# Patient Record
Sex: Female | Born: 1965
Health system: Southern US, Community
[De-identification: ages and names within clinical notes are randomized; demographics above are authoritative.]

## PROBLEM LIST (undated history)

## (undated) DIAGNOSIS — A630 Anogenital (venereal) warts: Secondary | ICD-10-CM

## (undated) DIAGNOSIS — M069 Rheumatoid arthritis, unspecified: Secondary | ICD-10-CM

## (undated) DIAGNOSIS — K219 Gastro-esophageal reflux disease without esophagitis: Secondary | ICD-10-CM

## (undated) DIAGNOSIS — I10 Essential (primary) hypertension: Secondary | ICD-10-CM

## (undated) DIAGNOSIS — M21619 Bunion of unspecified foot: Secondary | ICD-10-CM

## (undated) DIAGNOSIS — G039 Meningitis, unspecified: Secondary | ICD-10-CM

## (undated) DIAGNOSIS — E669 Obesity, unspecified: Secondary | ICD-10-CM

## (undated) DIAGNOSIS — E7800 Pure hypercholesterolemia, unspecified: Secondary | ICD-10-CM

## (undated) DIAGNOSIS — N751 Abscess of Bartholin's gland: Secondary | ICD-10-CM

## (undated) HISTORY — PX: CATARACT EXTRACTION: SUR2

## (undated) HISTORY — DX: Anogenital (venereal) warts: A63.0

## (undated) HISTORY — DX: Bunion of unspecified foot: M21.619

## (undated) HISTORY — DX: Abscess of Bartholin's gland: N75.1

## (undated) HISTORY — DX: Pure hypercholesterolemia, unspecified: E78.00

## (undated) HISTORY — PX: TOOTH EXTRACTION: SUR596

## (undated) HISTORY — DX: Rheumatoid arthritis, unspecified: M06.9

## (undated) HISTORY — DX: Meningitis, unspecified: G03.9

## (undated) HISTORY — DX: Gastro-esophageal reflux disease without esophagitis: K21.9

## (undated) HISTORY — PX: OTHER SURGICAL HISTORY: SHX169

## (undated) HISTORY — DX: Essential (primary) hypertension: I10

## (undated) HISTORY — DX: Obesity, unspecified: E66.9

---
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
## 2000-06-24 DIAGNOSIS — E7800 Pure hypercholesterolemia, unspecified: Secondary | ICD-10-CM

## 2000-06-24 HISTORY — DX: Pure hypercholesterolemia, unspecified: E78.00

## 2000-12-05 ENCOUNTER — Other Ambulatory Visit: Admission: RE | Admit: 2000-12-05 | Discharge: 2000-12-05 | Payer: Self-pay | Admitting: *Deleted

## 2001-12-28 ENCOUNTER — Other Ambulatory Visit: Admission: RE | Admit: 2001-12-28 | Discharge: 2001-12-28 | Payer: Self-pay | Admitting: Obstetrics and Gynecology

## 2003-01-06 ENCOUNTER — Other Ambulatory Visit: Admission: RE | Admit: 2003-01-06 | Discharge: 2003-01-06 | Payer: Self-pay | Admitting: Obstetrics and Gynecology

## 2004-01-11 ENCOUNTER — Other Ambulatory Visit: Admission: RE | Admit: 2004-01-11 | Discharge: 2004-01-11 | Payer: Self-pay | Admitting: Obstetrics and Gynecology

## 2005-01-11 ENCOUNTER — Other Ambulatory Visit: Admission: RE | Admit: 2005-01-11 | Discharge: 2005-01-11 | Payer: Self-pay | Admitting: Obstetrics and Gynecology

## 2005-02-19 HISTORY — PX: OTHER SURGICAL HISTORY: SHX169

## 2005-04-09 ENCOUNTER — Encounter: Admission: RE | Admit: 2005-04-09 | Discharge: 2005-06-23 | Payer: Self-pay | Admitting: Obstetrics and Gynecology

## 2006-08-04 ENCOUNTER — Other Ambulatory Visit: Admission: RE | Admit: 2006-08-04 | Discharge: 2006-08-04 | Payer: Self-pay | Admitting: Obstetrics and Gynecology

## 2006-09-02 ENCOUNTER — Ambulatory Visit: Payer: Self-pay | Admitting: Infectious Diseases

## 2007-08-14 ENCOUNTER — Other Ambulatory Visit: Admission: RE | Admit: 2007-08-14 | Discharge: 2007-08-14 | Payer: Self-pay | Admitting: Obstetrics and Gynecology

## 2008-08-17 ENCOUNTER — Other Ambulatory Visit: Admission: RE | Admit: 2008-08-17 | Discharge: 2008-08-17 | Payer: Self-pay | Admitting: Obstetrics and Gynecology

## 2010-06-24 DIAGNOSIS — M069 Rheumatoid arthritis, unspecified: Secondary | ICD-10-CM

## 2010-06-24 HISTORY — DX: Rheumatoid arthritis, unspecified: M06.9

## 2010-10-11 ENCOUNTER — Other Ambulatory Visit: Payer: Self-pay | Admitting: Family Medicine

## 2010-10-11 ENCOUNTER — Ambulatory Visit
Admission: RE | Admit: 2010-10-11 | Discharge: 2010-10-11 | Disposition: A | Discharge status: Home / Self Care | Payer: BC Managed Care – PPO | Source: Ambulatory Visit | Attending: Family Medicine | Admitting: Family Medicine

## 2010-10-11 DIAGNOSIS — M79605 Pain in left leg: Secondary | ICD-10-CM

## 2010-10-11 DIAGNOSIS — R609 Edema, unspecified: Secondary | ICD-10-CM

## 2012-08-20 ENCOUNTER — Other Ambulatory Visit: Payer: Self-pay | Admitting: Rheumatology

## 2012-08-20 DIAGNOSIS — M069 Rheumatoid arthritis, unspecified: Secondary | ICD-10-CM

## 2012-08-25 ENCOUNTER — Other Ambulatory Visit: Payer: BC Managed Care – PPO

## 2013-01-19 ENCOUNTER — Encounter: Payer: Self-pay | Admitting: Obstetrics and Gynecology

## 2013-01-20 ENCOUNTER — Encounter: Payer: Self-pay | Admitting: Obstetrics and Gynecology

## 2013-01-20 ENCOUNTER — Ambulatory Visit: Payer: Self-pay | Admitting: Obstetrics and Gynecology

## 2013-01-20 DIAGNOSIS — Z01419 Encounter for gynecological examination (general) (routine) without abnormal findings: Secondary | ICD-10-CM

## 2013-06-24 DIAGNOSIS — N751 Abscess of Bartholin's gland: Secondary | ICD-10-CM

## 2013-06-24 HISTORY — DX: Abscess of Bartholin's gland: N75.1

## 2013-12-20 ENCOUNTER — Encounter: Payer: Self-pay | Admitting: Obstetrics and Gynecology

## 2013-12-20 ENCOUNTER — Ambulatory Visit (INDEPENDENT_AMBULATORY_CARE_PROVIDER_SITE_OTHER): Payer: BC Managed Care – PPO | Admitting: Obstetrics and Gynecology

## 2013-12-20 VITALS — BP 114/80 | HR 76 | Resp 14 | Ht 62.5 in | Wt 194.6 lb

## 2013-12-20 DIAGNOSIS — E785 Hyperlipidemia, unspecified: Secondary | ICD-10-CM

## 2013-12-20 DIAGNOSIS — Z01419 Encounter for gynecological examination (general) (routine) without abnormal findings: Secondary | ICD-10-CM

## 2013-12-20 DIAGNOSIS — N751 Abscess of Bartholin's gland: Secondary | ICD-10-CM | POA: Insufficient documentation

## 2013-12-20 DIAGNOSIS — Z0000 Encounter for general adult medical examination without abnormal findings: Secondary | ICD-10-CM

## 2013-12-20 LAB — CBC
HCT: 40.4 % (ref 36.0–46.0)
HEMOGLOBIN: 13.7 g/dL (ref 12.0–15.0)
MCH: 32 pg (ref 26.0–34.0)
MCHC: 33.9 g/dL (ref 30.0–36.0)
MCV: 94.4 fL (ref 78.0–100.0)
PLATELETS: 262 10*3/uL (ref 150–400)
RBC: 4.28 MIL/uL (ref 3.87–5.11)
RDW: 14 % (ref 11.5–15.5)
WBC: 7 10*3/uL (ref 4.0–10.5)

## 2013-12-20 LAB — LIPID PANEL
CHOL/HDL RATIO: 4.3 ratio
Cholesterol: 217 mg/dL — ABNORMAL HIGH (ref 0–200)
HDL: 51 mg/dL (ref >39)
LDL Cholesterol: 152 mg/dL — ABNORMAL HIGH (ref 0–99)
Triglycerides: 69 mg/dL (ref <150)
VLDL: 14 mg/dL (ref 0–40)

## 2013-12-20 LAB — COMPREHENSIVE METABOLIC PANEL
ALBUMIN: 4.1 g/dL (ref 3.5–5.2)
ALT: 12 U/L (ref 0–35)
AST: 14 U/L (ref 0–37)
Alkaline Phosphatase: 67 U/L (ref 39–117)
BILIRUBIN TOTAL: 0.5 mg/dL (ref 0.2–1.2)
BUN: 16 mg/dL (ref 6–23)
CO2: 27 meq/L (ref 19–32)
Calcium: 9.1 mg/dL (ref 8.4–10.5)
Chloride: 104 mEq/L (ref 96–112)
Creat: 0.82 mg/dL (ref 0.50–1.10)
Glucose, Bld: 101 mg/dL — ABNORMAL HIGH (ref 70–99)
POTASSIUM: 5 meq/L (ref 3.5–5.3)
SODIUM: 138 meq/L (ref 135–145)
TOTAL PROTEIN: 7.5 g/dL (ref 6.0–8.3)

## 2013-12-20 LAB — TSH: TSH: 1.02 u[IU]/mL (ref 0.350–4.500)

## 2013-12-20 MED ORDER — FLUCONAZOLE 150 MG PO TABS: 150.0000 mg | ORAL_TABLET | Freq: Once | ORAL | Status: DC

## 2013-12-20 MED ORDER — CEPHALEXIN 500 MG PO CAPS: ORAL_CAPSULE | ORAL | Status: DC

## 2013-12-20 NOTE — Progress Notes (Signed)
Patient ID: Monique Carney, female   DOB: 1965-09-10, 48 y.o.   MRN: [id number] GYNECOLOGY VISIT  PCP:  None  Referring provider:   HPI: 48 y.o.   Married  Caucasian  female   G1P0010 with Patient's last menstrual period was 04/25/2011.   here for   AEX.  Popped a Bartholin's cyst that came up 5 days ago.  Had "fever" and pain.  Was trying to wait until office visit today so did not go to Urgent Care. Manipulated the gland on her own.  Drained bloody pus.  Had not had this before.  Feeling much better now but it itching.  Not using pads.   Wants labs done.   Hgb:   Urine:  Unable to void  GYNECOLOGIC HISTORY: Patient's last menstrual period was 04/25/2011. Sexually active:   yes Partner preference: female Contraception:  vasectomy  Menopausal hormone therapy: n/a DES exposure: no    Blood transfusions:   no Sexually transmitted diseases:   Condyloma of perirectal area proven on bx 02-19-05 GYN procedures and prior surgeries:  no Last mammogram: 12-17-13 with [facility]:results pending                Last pap and high risk HPV testing:  10-16-09 wnl  History of abnormal pap smear: 10-15 years ago had abnormal pap but no treatment to cervix.  Had repeat paps every six months for several paps and all returned normal    OB History   Grav Para Term Preterm Abortions TAB SAB Ect Mult Living   1    1     0       LIFESTYLE: Exercise:  walking            Tobacco:  no Alcohol:    3-4 glasses of wine per week Drug use:  no  OTHER HEALTH MAINTENANCE: Tetanus/TDap:   ?2004 Gardisil:              n/a Influenza:            03/2013 Zostavax:            n/a  Bone density:     n/a Colonoscopy:    n/a  Cholesterol check:   2010 borderline  Family History  Problem Relation Age of Onset  . CAD Father 61    CABG x 4    There are no active problems to display for this patient.  Past Medical History  Diagnosis Date  . Rheumatoid arthritis 2012    knees and hands  .  Hypercholesteremia 2002  . STD (sexually transmitted disease) 02-19-05    condyloma--periectal w/associated low grade dysplasia--proven on bx    Past Surgical History  Procedure Laterality Date  . Cataract extraction Right   . Perirectal biopsy  02-19-05    --condyloma w/associated low grade dysplasia    ALLERGIES: Review of patient's allergies indicates no known allergies.  Current Outpatient Prescriptions  Medication Sig Dispense Refill  . Aspirin-Acetaminophen-Caffeine (EXCEDRIN PO) Take by mouth.      . Methotrexate, PF, 20 MG/0.4ML SOAJ Inject into the skin.       No current facility-administered medications for this visit.     ROS:  Pertinent items are noted in HPI.  SOCIAL HISTORY:  Married.   PHYSICAL EXAMINATION:    BP 114/80  Pulse 76  Resp 14  Ht 5' 2.5" (1.588 m)  Wt 194 lb 9.6 oz (88.27 kg)  BMI 35.00 kg/m2  LMP 04/25/2011   Wt Readings  from Last 3 Encounters:  12/20/13 194 lb 9.6 oz (88.27 kg)     Ht Readings from Last 3 Encounters:  12/20/13 5' 2.5" (1.588 m)    General appearance: alert, cooperative and appears stated age Head: Normocephalic, without obvious abnormality, atraumatic Neck: no adenopathy, supple, symmetrical, trachea midline and thyroid not enlarged, symmetric, no tenderness/mass/nodules Lungs: clear to auscultation bilaterally Breasts: Inspection negative, No nipple retraction or dimpling, No nipple discharge or bleeding, No axillary or supraclavicular adenopathy, Normal to palpation without dominant masses Heart: regular rate and rhythm Abdomen: soft, non-tender; no masses,  no organomegaly Extremities: extremities normal, atraumatic, no cyanosis or edema Skin: Skin color, texture, turgor normal. No rashes or lesions Lymph nodes: Cervical, supraclavicular, and axillary nodes normal. No abnormal inguinal nodes palpated Neurologic: Grossly normal  Pelvic: External genitalia:  no lesions.              Urethra:  normal appearing  urethra with no masses, tenderness or lesions              Bartholins and Skenes: right with erythema and small amount of pus drainage.  Mildly tender to touch, minimally enlargement.               Vagina: normal appearing vagina with normal color and discharge, no lesions              Cervix: normal appearance              Pap and high risk HPV testing done: Yes.  .            Bimanual Exam:  Uterus:  uterus is normal size, shape, consistency and nontender                                      Adnexa: normal adnexa in size, nontender and no masses                                      Rectovaginal: Confirms                                      Anus:  normal sphincter tone, no lesions  ASSESSMENT  Right Bartholin's gland abscess just drained.  Otherwise normal well woman visit.  History of perianal condyloma and remote history of abnormal pap smear.  History of hyperlipidemia.   PLAN  Mammogram recommended yearly.  Pap smear and high risk HPV testing performed.  Counseled on self breast exam, Calcium and vitamin D intake, exercise. Keflex 500 mg po bid for 7 days.  Rx for Diflucan 150 mg now and at end of course of Keflex.  Return annually or prn   An After Visit Summary was printed and given to the patient.  10 additional minutes face to face time of which over 50% was spent in counseling.

## 2013-12-20 NOTE — Patient Instructions (Signed)
EXERCISE AND DIET:  We recommended that you start or continue a regular exercise program for good health. Regular exercise means any activity that makes your heart beat faster and makes you sweat.  We recommend exercising at least 30 minutes per day at least 3 days a week, preferably 4 or 5.  We also recommend a diet low in fat and sugar.  Inactivity, poor dietary choices and obesity can cause diabetes, heart attack, stroke, and kidney damage, among others.    ALCOHOL AND SMOKING:  Women should limit their alcohol intake to no more than 7 drinks/beers/glasses of wine (combined, not each!) per week. Moderation of alcohol intake to this level decreases your risk of breast cancer and liver damage. And of course, no recreational drugs are part of a healthy lifestyle.  And absolutely no smoking or even second hand smoke. Most people know smoking can cause heart and lung diseases, but did you know it also contributes to weakening of your bones? Aging of your skin?  Yellowing of your teeth and nails?  CALCIUM AND VITAMIN D:  Adequate intake of calcium and Vitamin D are recommended.  The recommendations for exact amounts of these supplements seem to change often, but generally speaking 600 mg of calcium (either carbonate or citrate) and 800 units of Vitamin D per day seems prudent. Certain women may benefit from higher intake of Vitamin D.  If you are among these women, your doctor will have told you during your visit.    PAP SMEARS:  Pap smears, to check for cervical cancer or precancers,  have traditionally been done yearly, although recent scientific advances have shown that most women can have pap smears less often.  However, every woman still should have a physical exam from her gynecologist every year. It will include a breast check, inspection of the vulva and vagina to check for abnormal growths or skin changes, a visual exam of the cervix, and then an exam to evaluate the size and shape of the uterus and  ovaries.  And after 49 years of age, a rectal exam is indicated to check for rectal cancers. We will also provide age appropriate advice regarding health maintenance, like when you should have certain vaccines, screening for sexually transmitted diseases, bone density testing, colonoscopy, mammograms, etc.   MAMMOGRAMS:  All women over 40 years old should have a yearly mammogram. Many facilities now offer a "3D" mammogram, which may cost around $50 extra out of pocket. If possible,  we recommend you accept the option to have the 3D mammogram performed.  It both reduces the number of women who will be called back for extra views which then turn out to be normal, and it is better than the routine mammogram at detecting truly abnormal areas.    COLONOSCOPY:  Colonoscopy to screen for colon cancer is recommended for all women at age 50.  We know, you hate the idea of the prep.  We agree, BUT, having colon cancer and not knowing it is worse!!  Colon cancer so often starts as a polyp that can be seen and removed at colonscopy, which can quite literally save your life!  And if your first colonoscopy is normal and you have no family history of colon cancer, most women don't have to have it again for 10 years.  Once every ten years, you can do something that may end up saving your life, right?  We will be happy to help you get it scheduled when you are ready.    Be sure to check your insurance coverage so you understand how much it will cost.  It may be covered as a preventative service at no cost, but you should check your particular policy.     Tetanus, Diphtheria, Pertussis (Tdap) Vaccine What You Need to Know WHY GET VACCINATED? Tetanus, diphtheria and pertussis can be very serious diseases, even for adolescents and adults. Tdap vaccine can protect [place] from these diseases. TETANUS (Lockjaw) causes painful muscle tightening and stiffness, usually all over the body.  It can lead to tightening of muscles in the  head and neck so you can't open your mouth, swallow, or sometimes even breathe. Tetanus kills about 1 out of 5 people who are infected. DIPHTHERIA can cause a thick coating to form in the back of the throat.  It can lead to breathing problems, paralysis, heart failure, and death. PERTUSSIS (Whooping Cough) causes severe coughing spells, which can cause difficulty breathing, vomiting and disturbed sleep.  It can also lead to weight loss, incontinence, and rib fractures. Up to 2 in 100 adolescents and 5 in 100 adults with pertussis are hospitalized or have complications, which could include pneumonia and death. These diseases are caused by bacteria. Diphtheria and pertussis are spread from person to person through coughing or sneezing. Tetanus enters the body through cuts, scratches, or wounds. Before vaccines, the [place] saw as many as 200,000 cases a year of diphtheria and pertussis, and hundreds of cases of tetanus. Since vaccination began, tetanus and diphtheria have dropped by about 99% and pertussis by about 80%. TDAP VACCINE Tdap vaccine can protect adolescents and adults from tetanus, diphtheria, and pertussis. One dose of Tdap is routinely given at age 46 or 70. People who did not get Tdap at that age should get it as soon as possible. Tdap is especially important for health care professionals and anyone having close contact with a baby younger than 12 months. Pregnant women should get a dose of Tdap during every pregnancy, to protect the newborn from pertussis. Infants are most at risk for severe, life-threatening complications from pertussis. A similar vaccine, called Td, protects from tetanus and diphtheria, but not pertussis. A Td booster should be given every 10 years. Tdap may be given as one of these boosters if you have not already gotten a dose. Tdap may also be given after a severe cut or burn to prevent tetanus infection. Your doctor can give you more information. Tdap may  safely be given at the same time as other vaccines. SOME PEOPLE SHOULD NOT GET THIS VACCINE  If you ever had a life-threatening allergic reaction after a dose of any tetanus, diphtheria, or pertussis containing vaccine, OR if you have a severe allergy to any part of this vaccine, you should not get Tdap. Tell your doctor if you have any severe allergies.  If you had a coma, or long or multiple seizures within 7 days after a childhood dose of DTP or DTaP, you should not get Tdap, unless a cause other than the vaccine was found. You can still get Td.  Talk to your doctor if you:  have epilepsy or another nervous system problem,  had severe pain or swelling after any vaccine containing diphtheria, tetanus or pertussis,  ever had Guillain-Barr Syndrome (GBS),  aren't feeling well on the day the shot is scheduled. RISKS OF A VACCINE REACTION With any medicine, including vaccines, there is a chance of side effects. These are usually mild and go away on their own, but  serious reactions are also possible. Brief fainting spells can follow a vaccination, leading to injuries from falling. Sitting or lying down for about 15 minutes can help prevent these. Tell your doctor if you feel dizzy or light-headed, or have vision changes or ringing in the ears. Mild problems following Tdap (Did not interfere with activities)  Pain where the shot was given (about 3 in 4 adolescents or 2 in 3 adults)  Redness or swelling where the shot was given (about 1 person in 5)  Mild fever of at least 100.93F (up to about 1 in 25 adolescents or 1 in 100 adults)  Headache (about 3 or 4 people in 10)  Tiredness (about 1 person in 3 or 4)  Nausea, vomiting, diarrhea, stomach ache (up to 1 in 4 adolescents or 1 in 10 adults)  Chills, body aches, sore joints, rash, swollen glands (uncommon) Moderate problems following Tdap (Interfered with activities, but did not require medical attention)  Pain where the shot was  given (about 1 in 5 adolescents or 1 in 100 adults)  Redness or swelling where the shot was given (up to about 1 in 16 adolescents or 1 in 25 adults)  Fever over 102F (about 1 in 100 adolescents or 1 in 250 adults)  Headache (about 3 in 20 adolescents or 1 in 10 adults)  Nausea, vomiting, diarrhea, stomach ache (up to 1 or 3 people in 100)  Swelling of the entire arm where the shot was given (up to about 3 in 100). Severe problems following Tdap (Unable to perform usual activities, required medical attention)  Swelling, severe pain, bleeding and redness in the arm where the shot was given (rare). A severe allergic reaction could occur after any vaccine (estimated less than 1 in a million doses). WHAT IF THERE IS A SERIOUS REACTION? What should I look for?  Look for anything that concerns you, such as signs of a severe allergic reaction, very high fever, or behavior changes. Signs of a severe allergic reaction can include hives, swelling of the face and throat, difficulty breathing, a fast heartbeat, dizziness, and weakness. These would start a few minutes to a few hours after the vaccination. What should I do?  If you think it is a severe allergic reaction or other emergency that can't wait, call 9-1-1 or get the person to the nearest hospital. Otherwise, call your doctor.  Afterward, the reaction should be reported to the "Vaccine Adverse Event Reporting System" (VAERS). Your doctor might file this report, or you can do it yourself through the VAERS web site at www.vaers.SamedayNews.es, or by calling 904-202-5293. VAERS is only for reporting reactions. They do not give medical advice.  THE NATIONAL VACCINE INJURY COMPENSATION PROGRAM The National Vaccine Injury Compensation Program (VICP) is a federal program that was created to compensate people who may have been injured by certain vaccines. Persons who believe they may have been injured by a vaccine can learn about the program and about  filing a claim by calling (617)637-8535 or visiting the [place] website at GoldCloset.com.ee. HOW CAN I LEARN MORE?  Ask your doctor.  Call your local or state health department.  Contact the Centers for Disease Control and Prevention (CDC):  Call 954-770-4564 or visit CDC's website at http://hunter.com/. CDC Tdap Vaccine VIS (10/31/11) Document Released: 12/10/2011 Document Revised: 10/05/2012 Document Reviewed: 09/30/2012 ExitCare Patient Information 2015 [place], [place]. This information is not intended to replace advice given to you by your health care provider. Make sure you discuss any questions  you have with your health care provider.

## 2013-12-22 LAB — IPS PAP TEST WITH HPV

## 2014-03-07 ENCOUNTER — Encounter: Payer: Self-pay | Admitting: Obstetrics and Gynecology

## 2014-03-30 ENCOUNTER — Telehealth: Payer: Self-pay | Admitting: Obstetrics and Gynecology

## 2014-03-30 NOTE — Telephone Encounter (Signed)
Spoke with patient. Patient states that her home voicemails roll over to her husbands phone and he told her that she received a call last week from our office. Advised I will speak with Dr.Silva and Estill Bamberg, CMA to see if they needed to discuss anything with patient and return call. Patient is agreeable.

## 2014-03-30 NOTE — Telephone Encounter (Signed)
Pt said that her husband relayed a message that she needed to call dr Quincy Simmonds back? I dont see any message at all.

## 2014-03-30 NOTE — Telephone Encounter (Signed)
It looks like the patient's appointment was changed for her upcoming annual in July.  Please confirm that she is aware of the current date.  I did not call the patient and I see no phone note from any clinical staff.  Thanks.

## 2014-03-31 NOTE — Telephone Encounter (Signed)
Left message to call Norlene Lanes at [phone]. 

## 2014-04-05 NOTE — Telephone Encounter (Signed)
Spoke with patient. Advised aex was moved from 01/05/2015 to 01/11/2015 at 8am with Dr.Silva. Patient is agreeable and verbalizes understanding.  Routing to provider for final review. Patient agreeable to disposition. Will close encounter

## 2014-04-25 ENCOUNTER — Encounter: Payer: Self-pay | Admitting: Obstetrics and Gynecology

## 2014-09-27 ENCOUNTER — Ambulatory Visit
Admission: RE | Admit: 2014-09-27 | Discharge: 2014-09-27 | Disposition: A | Discharge status: Home / Self Care | Payer: BLUE CROSS/BLUE SHIELD | Source: Ambulatory Visit | Attending: Family Medicine | Admitting: Family Medicine

## 2014-09-27 ENCOUNTER — Other Ambulatory Visit: Payer: Self-pay | Admitting: Family Medicine

## 2014-09-27 DIAGNOSIS — R05 Cough: Secondary | ICD-10-CM

## 2014-09-27 DIAGNOSIS — R053 Chronic cough: Secondary | ICD-10-CM

## 2014-11-24 ENCOUNTER — Ambulatory Visit: Payer: Self-pay | Admitting: Family Medicine

## 2015-01-05 ENCOUNTER — Ambulatory Visit: Payer: BC Managed Care – PPO | Admitting: Obstetrics and Gynecology

## 2015-01-11 ENCOUNTER — Telehealth: Payer: Self-pay | Admitting: Obstetrics and Gynecology

## 2015-01-11 ENCOUNTER — Ambulatory Visit (INDEPENDENT_AMBULATORY_CARE_PROVIDER_SITE_OTHER): Payer: BLUE CROSS/BLUE SHIELD | Admitting: Obstetrics and Gynecology

## 2015-01-11 ENCOUNTER — Encounter: Payer: Self-pay | Admitting: Obstetrics and Gynecology

## 2015-01-11 VITALS — BP 118/82 | HR 80 | Resp 18 | Ht 62.5 in | Wt 215.6 lb

## 2015-01-11 DIAGNOSIS — Z01419 Encounter for gynecological examination (general) (routine) without abnormal findings: Secondary | ICD-10-CM

## 2015-01-11 LAB — POCT URINALYSIS DIPSTICK
Bilirubin, UA: NEGATIVE
Glucose, UA: NEGATIVE
Ketones, UA: NEGATIVE
Leukocytes, UA: NEGATIVE
NITRITE UA: NEGATIVE
PH UA: 5
Protein, UA: NEGATIVE
RBC UA: NEGATIVE
Urobilinogen, UA: NEGATIVE

## 2015-01-11 NOTE — Progress Notes (Signed)
Patient ID: Monique Carney, female   DOB: February 25, 1966, 49 y.o.   MRN: [id number] 49 y.o. G19P0010 Married Caucasian female here for annual exam.    Now high blood pressure and elevated cholesterol.  States she needs to exercise more. Struggling with rheumatoid arthritis.   PCP:   Antony Blackbird, MD, Has Cardiologist also  Patient's last menstrual period was 04/25/2011.          Sexually active: Yes.   husband The current method of family planning is vasectomy/postmenopausal.    Exercising: No.  none. Smoker:  no  Health Maintenance: Pap:  12-20-13 wnl:neg HR HPV History of abnormal Pap:  Yes, 2000 had abnormal pap but not treatment to cervix. MMG:  6-25-15Density Cat:A Fatty tissue/neg:[facility] Colonoscopy:  01-06-15 polyps with Dr. Collene Mares.  Next due in 4-5 years per pt. BMD:   n/a  Result  n/a TDaP:  ?2004.  Declines vaccine today.  Screening Labs:  Hb today: PCP, Urine today: Neg   reports that she has never smoked. She does not have any smokeless tobacco history on file. She reports that she drinks about 3.0 oz of alcohol per week. She reports that she does not use illicit drugs.  Past Medical History  Diagnosis Date  . Rheumatoid arthritis 2012    knees and hands  . Hypercholesteremia 2002  . STD (sexually transmitted disease) 02-19-05    condyloma--periectal w/associated low grade dysplasia--proven on bx  . Hypertension   . Bartholin's gland abscess 2015    right    Past Surgical History  Procedure Laterality Date  . Cataract extraction Right   . Perirectal biopsy  02-19-05    --condyloma w/associated low grade dysplasia    Current Outpatient Prescriptions  Medication Sig Dispense Refill  . Aspirin-Acetaminophen-Caffeine (EXCEDRIN PO) Take by mouth.    . CRESTOR 20 MG tablet Take 1 tablet by mouth daily.  0  . lisinopril (PRINIVIL,ZESTRIL) 10 MG tablet Take 1 tablet by mouth daily.  1  . methotrexate (RHEUMATREX) 2.5 MG tablet TK 8 TS PO 1 TIME A WEEK  0  . naproxen (NAPROSYN)  500 MG tablet Take 1 tablet by mouth daily.  1  . traMADol (ULTRAM) 50 MG tablet Take 1 tablet by mouth as needed.  0   No current facility-administered medications for this visit.    Family History  Problem Relation Age of Onset  . CAD Father 26    CABG x 4    ROS:  Pertinent items are noted in HPI.  Otherwise, a comprehensive ROS was negative.  Exam:   BP 118/82 mmHg  Pulse 80  Resp 18  Ht 5' 2.5" (1.588 m)  Wt 215 lb 9.6 oz (97.796 kg)  BMI 38.78 kg/m2  LMP 04/25/2011    General appearance: alert, cooperative and appears stated age Head: Normocephalic, without obvious abnormality, atraumatic Neck: no adenopathy, supple, symmetrical, trachea midline and thyroid normal to inspection and palpation Lungs: clear to auscultation bilaterally Breasts: normal appearance, no masses or tenderness, Inspection negative, No nipple retraction or dimpling, No nipple discharge or bleeding, No axillary or supraclavicular adenopathy Heart: regular rate and rhythm Abdomen: soft, non-tender; bowel sounds normal; no masses,  no organomegaly Extremities: extremities normal, atraumatic, no cyanosis or edema Skin: Skin color, texture, turgor normal. No rashes or lesions Lymph nodes: Cervical, supraclavicular, and axillary nodes normal. No abnormal inguinal nodes palpated Neurologic: Grossly normal  Pelvic: External genitalia:  no lesions  Urethra:  normal appearing urethra with no masses, tenderness or lesions              Bartholins and Skenes: normal                 Vagina: normal appearing vagina with normal color and discharge, no lesions              Cervix: no lesions              Pap taken: No. Bimanual Exam:  Uterus:  normal size, contour, position, consistency, mobility, non-tender              Adnexa: normal adnexa and no mass, fullness, tenderness              Rectovaginal: Yes.  .  Confirms.              Anus:  normal sphincter tone, no lesions  Chaperone was present  for exam.  Assessment:   Well woman visit with normal exam. History of perianal condyloma and remote history of abnormal pap smear.   Plan: Yearly mammogram recommended after age 59.  Recommended self breast exam.  Pap and HR HPV as above. Pap next year with HR HPV ttesting. Discussed Calcium, Vitamin D, regular exercise program including cardiovascular and weight bearing exercise. Labs performed.  No..     Refills given on medications.  No..    Follow up annually and prn.    After visit summary provided.

## 2015-01-11 NOTE — Patient Instructions (Signed)

## 2015-01-11 NOTE — Telephone Encounter (Signed)
Patient gave the name of her cardiologist at check out, Neldon Labella, NP-C @ [facility]

## 2015-01-11 NOTE — Telephone Encounter (Signed)
Thank you. Encounter closed. 

## 2015-08-04 ENCOUNTER — Other Ambulatory Visit: Payer: Self-pay | Admitting: Family

## 2015-08-04 ENCOUNTER — Ambulatory Visit
Admission: RE | Admit: 2015-08-04 | Discharge: 2015-08-04 | Disposition: A | Discharge status: Home / Self Care | Payer: BLUE CROSS/BLUE SHIELD | Source: Ambulatory Visit | Attending: Family | Admitting: Family

## 2015-08-04 DIAGNOSIS — M25551 Pain in right hip: Secondary | ICD-10-CM

## 2015-09-26 DIAGNOSIS — H5213 Myopia, bilateral: Secondary | ICD-10-CM | POA: Diagnosis not present

## 2015-09-29 ENCOUNTER — Other Ambulatory Visit: Payer: Self-pay | Admitting: Family Medicine

## 2015-09-29 ENCOUNTER — Other Ambulatory Visit (HOSPITAL_COMMUNITY)
Admission: RE | Admit: 2015-09-29 | Discharge: 2015-09-29 | Disposition: A | Discharge status: Home / Self Care | Payer: BLUE CROSS/BLUE SHIELD | Source: Ambulatory Visit | Attending: Family Medicine | Admitting: Family Medicine

## 2015-09-29 DIAGNOSIS — Z79899 Other long term (current) drug therapy: Secondary | ICD-10-CM | POA: Diagnosis not present

## 2015-09-29 DIAGNOSIS — Z01419 Encounter for gynecological examination (general) (routine) without abnormal findings: Secondary | ICD-10-CM | POA: Diagnosis not present

## 2015-09-29 DIAGNOSIS — E785 Hyperlipidemia, unspecified: Secondary | ICD-10-CM | POA: Diagnosis not present

## 2015-10-02 LAB — CYTOLOGY - PAP

## 2015-10-04 DIAGNOSIS — M2550 Pain in unspecified joint: Secondary | ICD-10-CM | POA: Diagnosis not present

## 2015-10-04 DIAGNOSIS — Z79899 Other long term (current) drug therapy: Secondary | ICD-10-CM | POA: Diagnosis not present

## 2015-10-04 DIAGNOSIS — M0589 Other rheumatoid arthritis with rheumatoid factor of multiple sites: Secondary | ICD-10-CM | POA: Diagnosis not present

## 2016-01-03 DIAGNOSIS — M0589 Other rheumatoid arthritis with rheumatoid factor of multiple sites: Secondary | ICD-10-CM | POA: Diagnosis not present

## 2016-01-03 DIAGNOSIS — Z79899 Other long term (current) drug therapy: Secondary | ICD-10-CM | POA: Diagnosis not present

## 2016-01-03 DIAGNOSIS — M2550 Pain in unspecified joint: Secondary | ICD-10-CM | POA: Diagnosis not present

## 2016-01-31 ENCOUNTER — Ambulatory Visit: Payer: BLUE CROSS/BLUE SHIELD | Admitting: Obstetrics and Gynecology

## 2016-03-20 DIAGNOSIS — Z23 Encounter for immunization: Secondary | ICD-10-CM | POA: Diagnosis not present

## 2016-04-09 ENCOUNTER — Telehealth: Payer: Self-pay

## 2016-04-09 NOTE — Telephone Encounter (Signed)
Left message to call [address] at [phone].   Received notification from [facility] that the patient has not returned for recommended 6 month follow up left breast ultrasound for left breast mass. Need to help facilitate scheduling. Patient is also overdue for her annual exam with Dr.Silva.

## 2016-04-15 NOTE — Telephone Encounter (Signed)
Will forward to clinical supervisor regarding protocol for mammogram recall for this patient.   Cc- Lamont Snowball

## 2016-04-15 NOTE — Telephone Encounter (Signed)
Spoke with patient. Patient states that she had her aex in February this year with her PCP. States she will contact [facility] at this time to schedule her 6 month follow up ultrasound appointment. Offered to assist patient with scheduling, but patient declines.  Routing to provider for final review.

## 2016-04-19 DIAGNOSIS — M0589 Other rheumatoid arthritis with rheumatoid factor of multiple sites: Secondary | ICD-10-CM | POA: Diagnosis not present

## 2016-04-19 DIAGNOSIS — Z79899 Other long term (current) drug therapy: Secondary | ICD-10-CM | POA: Diagnosis not present

## 2016-04-23 ENCOUNTER — Encounter: Payer: Self-pay | Admitting: *Deleted

## 2016-04-23 NOTE — Telephone Encounter (Signed)
To your desk for review and signature.

## 2016-04-29 ENCOUNTER — Encounter: Payer: Self-pay | Admitting: *Deleted

## 2016-07-03 DIAGNOSIS — M2550 Pain in unspecified joint: Secondary | ICD-10-CM | POA: Diagnosis not present

## 2016-07-03 DIAGNOSIS — M0589 Other rheumatoid arthritis with rheumatoid factor of multiple sites: Secondary | ICD-10-CM | POA: Diagnosis not present

## 2016-07-03 DIAGNOSIS — Z1321 Encounter for screening for nutritional disorder: Secondary | ICD-10-CM | POA: Diagnosis not present

## 2016-07-03 DIAGNOSIS — Z79899 Other long term (current) drug therapy: Secondary | ICD-10-CM | POA: Diagnosis not present

## 2016-07-13 DIAGNOSIS — Z1231 Encounter for screening mammogram for malignant neoplasm of breast: Secondary | ICD-10-CM | POA: Diagnosis not present

## 2016-07-13 LAB — HM MAMMOGRAPHY

## 2016-07-31 ENCOUNTER — Encounter: Payer: Self-pay | Admitting: Obstetrics and Gynecology

## 2016-09-08 DIAGNOSIS — M25532 Pain in left wrist: Secondary | ICD-10-CM | POA: Diagnosis not present

## 2016-10-04 DIAGNOSIS — M0589 Other rheumatoid arthritis with rheumatoid factor of multiple sites: Secondary | ICD-10-CM | POA: Diagnosis not present

## 2016-10-04 DIAGNOSIS — Z79899 Other long term (current) drug therapy: Secondary | ICD-10-CM | POA: Diagnosis not present

## 2017-01-02 DIAGNOSIS — M7060 Trochanteric bursitis, unspecified hip: Secondary | ICD-10-CM | POA: Diagnosis not present

## 2017-01-02 DIAGNOSIS — Z79899 Other long term (current) drug therapy: Secondary | ICD-10-CM | POA: Diagnosis not present

## 2017-01-02 DIAGNOSIS — E669 Obesity, unspecified: Secondary | ICD-10-CM | POA: Diagnosis not present

## 2017-01-02 DIAGNOSIS — M0589 Other rheumatoid arthritis with rheumatoid factor of multiple sites: Secondary | ICD-10-CM | POA: Diagnosis not present

## 2017-01-16 ENCOUNTER — Encounter: Payer: Self-pay | Admitting: Family Medicine

## 2017-01-16 ENCOUNTER — Ambulatory Visit (INDEPENDENT_AMBULATORY_CARE_PROVIDER_SITE_OTHER): Payer: BLUE CROSS/BLUE SHIELD | Admitting: Family Medicine

## 2017-01-16 VITALS — BP 132/94 | HR 87 | Temp 97.8°F | Wt 221.2 lb

## 2017-01-16 DIAGNOSIS — K1120 Sialoadenitis, unspecified: Secondary | ICD-10-CM

## 2017-01-16 DIAGNOSIS — R7989 Other specified abnormal findings of blood chemistry: Secondary | ICD-10-CM | POA: Diagnosis not present

## 2017-01-16 DIAGNOSIS — I10 Essential (primary) hypertension: Secondary | ICD-10-CM

## 2017-01-16 DIAGNOSIS — Z13220 Encounter for screening for lipoid disorders: Secondary | ICD-10-CM | POA: Diagnosis not present

## 2017-01-16 DIAGNOSIS — R635 Abnormal weight gain: Secondary | ICD-10-CM | POA: Diagnosis not present

## 2017-01-16 LAB — COMPREHENSIVE METABOLIC PANEL
ALT: 35 U/L (ref 0–35)
AST: 17 U/L (ref 0–37)
Albumin: 4.2 g/dL (ref 3.5–5.2)
Alkaline Phosphatase: 73 U/L (ref 39–117)
BUN: 13 mg/dL (ref 6–23)
CO2: 28 mEq/L (ref 19–32)
Calcium: 9.6 mg/dL (ref 8.4–10.5)
Chloride: 107 mEq/L (ref 96–112)
Creatinine, Ser: 0.81 mg/dL (ref 0.40–1.20)
GFR: 78.86 mL/min (ref >60.00)
Glucose, Bld: 102 mg/dL — ABNORMAL HIGH (ref 70–99)
Potassium: 4.9 mEq/L (ref 3.5–5.1)
Sodium: 142 mEq/L (ref 135–145)
Total Bilirubin: 0.6 mg/dL (ref 0.2–1.2)
Total Protein: 7.3 g/dL (ref 6.0–8.3)

## 2017-01-16 LAB — CBC WITH DIFFERENTIAL/PLATELET
Basophils Absolute: 0 10*3/uL (ref 0.0–0.1)
Basophils Relative: 0.5 % (ref 0.0–3.0)
Eosinophils Absolute: 0.2 10*3/uL (ref 0.0–0.7)
Eosinophils Relative: 3 % (ref 0.0–5.0)
HCT: 42 % (ref 36.0–46.0)
Hemoglobin: 14.1 g/dL (ref 12.0–15.0)
Lymphocytes Relative: 21.3 % (ref 12.0–46.0)
Lymphs Abs: 1.5 10*3/uL (ref 0.7–4.0)
MCHC: 33.6 g/dL (ref 30.0–36.0)
MCV: 97.2 fl (ref 78.0–100.0)
Monocytes Absolute: 0.6 10*3/uL (ref 0.1–1.0)
Monocytes Relative: 8.4 % (ref 3.0–12.0)
Neutro Abs: 4.6 10*3/uL (ref 1.4–7.7)
Neutrophils Relative %: 66.8 % (ref 43.0–77.0)
Platelets: 220 10*3/uL (ref 150.0–400.0)
RBC: 4.32 Mil/uL (ref 3.87–5.11)
RDW: 14.4 % (ref 11.5–15.5)
WBC: 6.8 10*3/uL (ref 4.0–10.5)

## 2017-01-16 LAB — LIPID PANEL
Cholesterol: 219 mg/dL — ABNORMAL HIGH (ref 0–200)
HDL: 44.4 mg/dL (ref >39.00)
LDL Cholesterol: 145 mg/dL — ABNORMAL HIGH (ref 0–99)
NonHDL: 174.15
Total CHOL/HDL Ratio: 5
Triglycerides: 144 mg/dL (ref 0.0–149.0)
VLDL: 28.8 mg/dL (ref 0.0–40.0)

## 2017-01-16 LAB — HEMOGLOBIN A1C: Hgb A1c MFr Bld: 5.6 % (ref 4.6–6.5)

## 2017-01-16 LAB — VITAMIN D 25 HYDROXY (VIT D DEFICIENCY, FRACTURES): VITD: 34.62 ng/mL (ref 30.00–100.00)

## 2017-01-16 MED ORDER — AMOXICILLIN-POT CLAVULANATE 875-125 MG PO TABS: 1.0000 | ORAL_TABLET | Freq: Two times a day (BID) | ORAL | 0 refills | Status: DC

## 2017-01-16 NOTE — Progress Notes (Signed)
Monique Carney is a 51 y.o. female is here to Monique Carney.   Patient Care Team: Monique Deutscher, DO as PCP - General (Family Medicine)   History of Present Illness:   Monique Carney CMA acting as scribe for Dr. Juleen Carney.  HPI: Patient comes in today to establish care. She is coming from [place]. She has multiple concerns.   Lump: Patient comes in today with a lump in her left jaw line that started Sunday night. She does have some pain in that area. Has a sore inside the mouth on the cheek area. Left paridrotic gland swollen upon exam. Will prescribe antibiotic today.   Hypertension: Patients blood pressure stable right now. When she checks at the stores diastolic number has been in the 100's. Patient does snore at night. Will get labs today and possibly order a home sleep study. Get blood pressure machine to check at home. Will give log to return to [place]. Will work on weight loss to help with blood pressure.   RA: Patient sees Monique Carney rheumatology. She was diagnosed about 7 years ago. She does not get injections in the hip for her hip pain. She does not exercise any. She does not know what to do about the exercise. Not a gym person. She has gained about 60 pounds since being being diagnosed. Will place referral to [facility] to help with the pain.   Weight gain: She is concerned about weight gain. Discussed possibly going on weight loss medication. Will get labs then discuss weight loss. Will place referral to [facility] to help with weight loss.  The 10-year ASCVD risk score Monique Carney., et al., 2013) is: 2.2%   Values used to calculate the score:     Age: 42 years     Sex: Female     Is Non-Hispanic African American: No     Diabetic: No     Tobacco smoker: No     Systolic Blood Pressure: 676 mmHg     Is BP treated: No     HDL Cholesterol: 44.4 mg/dL     Total Cholesterol: 219 mg/dL  Health Maintenance Due  Topic Date Due  . HIV Screening  11/15/1979  . TETANUS/TDAP   06/24/2012  . COLONOSCOPY  11/15/2014   PMHx, SurgHx, SocialHx, Medications, and Allergies were reviewed in the Visit Navigator and updated as appropriate.   Past Medical History:  Diagnosis Date  . Bartholin's gland abscess, right   . Condyloma acuminatum of perianal region   . Hypercholesteremia   . Hypertension   . Rheumatoid arthritis Monique Carney)    Past Surgical History:  Procedure Laterality Date  . CATARACT EXTRACTION Right   . perirectal biopsy  02-19-05   --condyloma w/associated low grade dysplasia   Family History  Problem Relation Age of Onset  . CAD Monique Carney 68       CABG x 4   Social History  Substance Use Topics  . Smoking status: Former Smoker    Types: Cigarettes  . Smokeless tobacco: Never Used     Comment: Smoked from ages [age] years old.   . Alcohol use 3.0 oz/week    5 Standard drinks or equivalent per week     Comment: 3-5 glasses of wine per week   Current Medications and Allergies:   .  Aspirin-Acetaminophen-Caffeine (EXCEDRIN PO), Take by mouth., Disp: , Rfl:  .  CRESTOR 20 MG tablet, Take 1 tablet by mouth daily., Disp: , Rfl: 0 .  methotrexate ([place])  2.5 MG tablet, TK 8 TS PO 1 TIME A WEEK, Disp: , Rfl: 0 .  naproxen (NAPROSYN) 500 MG tablet, Take 1 tablet by mouth daily., Disp: , Rfl: 1 .  traMADol (ULTRAM) 50 MG tablet, Take 1 tablet by mouth as needed., Disp: , Rfl: 0  No Known Allergies   Review of Systems:   Pertinent items are noted in the HPI. Otherwise, ROS is negative.  Vitals:   Vitals:   01/16/17 0742  BP: (!) 132/94  Pulse: 87  Temp: 97.8 F (36.6 C)  TempSrc: Oral  SpO2: 96%  Weight: 221 lb 3.2 oz (100.3 kg)     Body mass index is 39.81 kg/m.  Physical Exam:   Physical Exam  Constitutional: She appears well-developed and well-nourished. No distress.  HENT:  Head: Normocephalic and atraumatic.  Left parotid swelling and ttp.  Eyes: Pupils are equal, round, and reactive to light. EOM are normal.  Neck: Normal  range of motion. Neck supple.  Cardiovascular: Normal rate, regular rhythm, normal heart sounds and intact distal pulses.   Pulmonary/Chest: Effort normal.  Abdominal: Soft.  Skin: Skin is warm.  Psychiatric: She has a normal mood and affect. Her behavior is normal.  Nursing note and vitals reviewed.  Results for orders placed or performed in visit on 01/16/17  CBC with Differential/Platelet  Result Value Ref Range   WBC 6.8 4.0 - 10.5 K/uL   RBC 4.32 3.87 - 5.11 Mil/uL   Hemoglobin 14.1 12.0 - 15.0 g/dL   HCT 42.0 36.0 - 46.0 %   MCV 97.2 78.0 - 100.0 fl   MCHC 33.6 30.0 - 36.0 g/dL   RDW 14.4 11.5 - 15.5 %   Platelets 220.0 150.0 - 400.0 K/uL   Neutrophils Relative % 66.8 43.0 - 77.0 %   Lymphocytes Relative 21.3 12.0 - 46.0 %   Monocytes Relative 8.4 3.0 - 12.0 %   Eosinophils Relative 3.0 0.0 - 5.0 %   Basophils Relative 0.5 0.0 - 3.0 %   Neutro Abs 4.6 1.4 - 7.7 K/uL   Lymphs Abs 1.5 0.7 - 4.0 K/uL   Monocytes Absolute 0.6 0.1 - 1.0 K/uL   Eosinophils Absolute 0.2 0.0 - 0.7 K/uL   Basophils Absolute 0.0 0.0 - 0.1 K/uL  Comprehensive metabolic panel  Result Value Ref Range   Sodium 142 135 - 145 mEq/L   Potassium 4.9 3.5 - 5.1 mEq/L   Chloride 107 96 - 112 mEq/L   CO2 28 19 - 32 mEq/L   Glucose, Bld 102 (H) 70 - 99 mg/dL   BUN 13 6 - 23 mg/dL   Creatinine, Ser 0.81 0.40 - 1.20 mg/dL   Total Bilirubin 0.6 0.2 - 1.2 mg/dL   Alkaline Phosphatase 73 39 - 117 U/L   AST 17 0 - 37 U/L   ALT 35 0 - 35 U/L   Total Protein 7.3 6.0 - 8.3 g/dL   Albumin 4.2 3.5 - 5.2 g/dL   Calcium 9.6 8.4 - 10.5 mg/dL   GFR 78.86 >60.00 mL/min  Lipid panel  Result Value Ref Range   Cholesterol 219 (H) 0 - 200 mg/dL   Triglycerides 144.0 0.0 - 149.0 mg/dL   HDL 44.40 >39.00 mg/dL   VLDL 28.8 0.0 - 40.0 mg/dL   LDL Cholesterol 145 (H) 0 - 99 mg/dL   Total CHOL/HDL Ratio 5    NonHDL 174.15   VITAMIN D 25 Hydroxy (Vit-D Deficiency, Fractures)  Result Value Ref Range   VITD 34.62  30.00 -  100.00 ng/mL  Hemoglobin A1c  Result Value Ref Range   Hgb A1c MFr Bld 5.6 4.6 - 6.5 %   Assessment and Plan:   Monique Carney was seen today for establish care.  Diagnoses and all orders for this visit:  Parotitis Comments: Mild. See AVS. Orders: -     CBC with Differential/Platelet -     amoxicillin-clavulanate (AUGMENTIN) 875-125 MG tablet; Take 1 tablet by mouth 2 (two) times daily.  Essential hypertension Comments: Will work on weight loss to bring down BP. I asked her to monitor.  Orders: -     CBC with Differential/Platelet -     Comprehensive metabolic panel -     Lipid panel  Weight gain Comments: Reviewed methods of weight loss. The patient is asked to make an attempt to improve diet and exercise patterns to aid in medical management of this problem. Rx for [employer], offered nutrition with [employer]. Orders: -     VITAMIN D 25 Hydroxy (Vit-D Deficiency, Fractures) -     Hemoglobin A1c  Low serum vitamin D -     VITAMIN D 25 Hydroxy (Vit-D Deficiency, Fractures)  Screening for lipid disorders -     Lipid panel  Morbid obesity (HCC) Comments: The patient is asked to make an attempt to improve diet and exercise patterns to aid in medical management of this problem.     . Reviewed expectations re: course of current medical issues. . Discussed self-management of symptoms. . Outlined signs and symptoms indicating need for more acute intervention. . Patient verbalized understanding and all questions were answered. Marland Kitchen Health Maintenance issues including appropriate healthy diet, exercise, and smoking avoidance were discussed with patient. . See orders for this visit as documented in the electronic medical record. . Patient received an After Visit Summary.  CMA served as Education administrator during this visit. History, Physical, and Plan performed by medical provider. The above documentation has been reviewed and is accurate and complete. Monique Carney, D.O.  Monique Deutscher,  DO , [facility] 01/16/2017  Future Appointments Date Time Provider [place]  04/18/2017 7:45 AM Monique Deutscher, DO [facility] None

## 2017-01-16 NOTE — Progress Notes (Signed)
  HPI:  Physical Exam

## 2017-01-16 NOTE — Patient Instructions (Addendum)
Please start checking your blood pressure at home and keep a log.    Parotitis Parotitis means that you have irritation and swelling (inflammation) in one or both of your parotid glands. These glands make spit (saliva). They are found on each side of your face, below and in front of your earlobes. You may or may not have pain with this condition. Follow these instructions at home: Medicines  Take over-the-counter and prescription medicines only as told by your doctor.  If you were prescribed an antibiotic medicine, take it as told by your doctor. Do not stop taking the antibiotic even if you start to feel better. Managing pain and swelling  Apply warm cloths (compresses) to the swollen area as told by your doctor.  Gently rub your parotid glands as told by your doctor. General instructions   Drink enough fluid to keep your pee (urine) clear or pale yellow.  Suck on sour candy. This may help: ? To make your mouth less dry. ? To make more spit.  Keep your mouth clean and moist. ? Gargle with a salt-water mixture 3-4 times per day, or as needed. ? To make a salt-water mixture, stir -1 tsp of salt into 1 cup of warm water.  Take good care of your mouth: ? Brush your teeth at least two times per day. ? Floss your teeth every day. ? See your dentist regularly.  Do not use tobacco products. These include cigarettes, chewing tobacco, or e-cigarettes. If you need help quitting,  ask your doctor.  Keep all follow-up visits as told by your doctor. This is important. Contact a doctor if:  You have a fever or chills.  You have new symptoms.  Your symptoms get worse.  Your symptoms do not get better with treatment. This information is not intended to replace advice given to you by your health care provider. Make sure you discuss any questions you have with your health care provider. Document Released: 07/13/2010 Document Revised: 11/16/2015 Document Reviewed: 11/03/2014 Elsevier  Interactive Patient Education  [employer].

## 2017-01-21 ENCOUNTER — Telehealth: Payer: Self-pay | Admitting: Family Medicine

## 2017-01-21 NOTE — Telephone Encounter (Signed)
Patient is requesting a call once lab results are reviewed and ready. Also, patient would like to discuss diet medication. Please call patient on her mobile to advise, okay to leave a detailed message on phone.

## 2017-01-23 NOTE — Telephone Encounter (Signed)
Labs completed on 7/26. Per OV pt was to try losing weight on her own, wanted to obtain labs to possibly consider medication. Please advise.

## 2017-01-27 ENCOUNTER — Other Ambulatory Visit: Payer: Self-pay | Admitting: Family Medicine

## 2017-01-27 DIAGNOSIS — E8881 Metabolic syndrome: Secondary | ICD-10-CM

## 2017-01-27 MED ORDER — METFORMIN HCL 500 MG PO TABS: 500.0000 mg | ORAL_TABLET | Freq: Every day | ORAL | 1 refills | Status: DC

## 2017-01-27 NOTE — Telephone Encounter (Signed)
Spoke with pt regarding blood work and starting Metformin. Scheduled a 1 month follow up. Pt would like to know if Dr Juleen China decided if she can start a diet medication or if she needs to wait until after her follow up next month.

## 2017-01-27 NOTE — Progress Notes (Signed)
Result note per provider:       Labs okay. Fasting blood sugar slightly elevated. A1c 5.6. Consistent with mild insulin resistance. Please call in Metformin 500 mg to take daily. We can try this first. After one month, ask patient to come back for recheck to discuss another medication if she does not feel that it is helping enough.    Placed order for 30 days and 1 refill

## 2017-01-27 NOTE — Telephone Encounter (Signed)
Called the number in EPIC for Monique Carney in reference to the PREP referral.  Called on 01/20/17 as well and left VM w/her husband at the number provided on the referral form which is a different number than indicated in EPIC.

## 2017-02-14 DIAGNOSIS — H5213 Myopia, bilateral: Secondary | ICD-10-CM | POA: Diagnosis not present

## 2017-03-03 ENCOUNTER — Telehealth: Payer: Self-pay

## 2017-03-03 NOTE — Telephone Encounter (Signed)
Left VM for Monique Carney regarding the referral for the PREP for her to call back about her interest in participating.

## 2017-03-05 ENCOUNTER — Ambulatory Visit (INDEPENDENT_AMBULATORY_CARE_PROVIDER_SITE_OTHER): Payer: BLUE CROSS/BLUE SHIELD | Admitting: Family Medicine

## 2017-03-05 ENCOUNTER — Encounter: Payer: Self-pay | Admitting: Family Medicine

## 2017-03-05 MED ORDER — METFORMIN HCL 1000 MG PO TABS: 1000.0000 mg | ORAL_TABLET | Freq: Every day | ORAL | 1 refills | Status: DC

## 2017-03-05 MED ORDER — PHENTERMINE HCL 37.5 MG PO TABS: 37.5000 mg | ORAL_TABLET | Freq: Every day | ORAL | 1 refills | Status: DC

## 2017-03-05 NOTE — Progress Notes (Addendum)
Monique Carney is a 51 y.o. female is here for follow up.  History of Present Illness:   HPI: See AP for Problem Based discussion.  Health Maintenance Due  Topic Date Due  . HIV Screening  11/15/1979  . TETANUS/TDAP  06/24/2012  . COLONOSCOPY  11/15/2014  . INFLUENZA VACCINE  01/22/2017   Depression screen PHQ 2/9 01/16/2017  Decreased Interest 0  Down, Depressed, Hopeless 0  PHQ - 2 Score 0   PMHx, SurgHx, SocialHx, FamHx, Medications, and Allergies were reviewed in the Visit Navigator and updated as appropriate.   Patient Active Problem List   Diagnosis Date Noted  . Pure hypercholesterolemia 03/08/2017  . RA (rheumatoid arthritis) ([place]) 03/08/2017  . Hypertension 01/16/2017  . Morbid obesity ([place]) 01/16/2017   Social History  Substance Use Topics  . Smoking status: Former Smoker    Types: Cigarettes  . Smokeless tobacco: Never Used     Comment: Smoked from ages [age] years old.   . Alcohol use 3.0 oz/week    5 Standard drinks or equivalent per week     Comment: 3-5 glasses of wine per week   Current Medications and Allergies:   .  Aspirin-Acetaminophen-Caffeine (EXCEDRIN PO), Take by mouth., Disp: , Rfl:  .  CRESTOR 20 MG tablet, Take 1 tablet by mouth daily., Disp: , Rfl: 0 .  methotrexate (RHEUMATREX) 2.5 MG tablet, TK 8 TS PO 1 TIME A WEEK, Disp: , Rfl: 0 .  naproxen (NAPROSYN) 500 MG tablet, Take 1 tablet by mouth daily., Disp: , Rfl: 1 .  traMADol (ULTRAM) 50 MG tablet, Take 1 tablet by mouth as needed., Disp: , Rfl: 0  No Known Allergies   Review of Systems   Pertinent items are noted in the HPI. Otherwise, ROS is negative.  Vitals:   Vitals:   03/05/17 0748  BP: 132/84  Pulse: 73  Temp: 98.3 F (36.8 C)  TempSrc: Oral  SpO2: 98%  Weight: 221 lb 12.8 oz (100.6 kg)  Height: 5' 2.8" (1.595 m)     Body mass index is 39.54 kg/m.   Physical Exam:   Physical Exam  Constitutional: She is oriented to person, place, and time. She appears  well-developed and well-nourished. No distress.  HENT:  Head: Normocephalic and atraumatic.  Eyes: Pupils are equal, round, and reactive to light. Conjunctivae and EOM are normal.  Neck: Normal range of motion. Neck supple. No thyromegaly present.  Cardiovascular: Normal rate, regular rhythm, normal heart sounds and intact distal pulses.   Pulmonary/Chest: Effort normal and breath sounds normal.  Abdominal: Soft. Bowel sounds are normal.  Musculoskeletal: Normal range of motion.  Neurological: She is alert and oriented to person, place, and time.  Skin: Skin is warm. Capillary refill takes less than 2 seconds.  Psychiatric: She has a normal mood and affect. Her behavior is normal.  Nursing note and vitals reviewed.   Results for orders placed or performed in visit on 01/16/17  CBC with Differential/Platelet  Result Value Ref Range   WBC 6.8 4.0 - 10.5 K/uL   RBC 4.32 3.87 - 5.11 Mil/uL   Hemoglobin 14.1 12.0 - 15.0 g/dL   HCT 42.0 36.0 - 46.0 %   MCV 97.2 78.0 - 100.0 fl   MCHC 33.6 30.0 - 36.0 g/dL   RDW 14.4 11.5 - 15.5 %   Platelets 220.0 150.0 - 400.0 K/uL   Neutrophils Relative % 66.8 43.0 - 77.0 %   Lymphocytes Relative 21.3 12.0 - 46.0 %  Monocytes Relative 8.4 3.0 - 12.0 %   Eosinophils Relative 3.0 0.0 - 5.0 %   Basophils Relative 0.5 0.0 - 3.0 %   Neutro Abs 4.6 1.4 - 7.7 K/uL   Lymphs Abs 1.5 0.7 - 4.0 K/uL   Monocytes Absolute 0.6 0.1 - 1.0 K/uL   Eosinophils Absolute 0.2 0.0 - 0.7 K/uL   Basophils Absolute 0.0 0.0 - 0.1 K/uL  Comprehensive metabolic panel  Result Value Ref Range   Sodium 142 135 - 145 mEq/L   Potassium 4.9 3.5 - 5.1 mEq/L   Chloride 107 96 - 112 mEq/L   CO2 28 19 - 32 mEq/L   Glucose, Bld 102 (H) 70 - 99 mg/dL   BUN 13 6 - 23 mg/dL   Creatinine, Ser 0.81 0.40 - 1.20 mg/dL   Total Bilirubin 0.6 0.2 - 1.2 mg/dL   Alkaline Phosphatase 73 39 - 117 U/L   AST 17 0 - 37 U/L   ALT 35 0 - 35 U/L   Total Protein 7.3 6.0 - 8.3 g/dL   Albumin 4.2  3.5 - 5.2 g/dL   Calcium 9.6 8.4 - 10.5 mg/dL   GFR 78.86 >60.00 mL/min  Lipid panel  Result Value Ref Range   Cholesterol 219 (H) 0 - 200 mg/dL   Triglycerides 144.0 0.0 - 149.0 mg/dL   HDL 44.40 >39.00 mg/dL   VLDL 28.8 0.0 - 40.0 mg/dL   LDL Cholesterol 145 (H) 0 - 99 mg/dL   Total CHOL/HDL Ratio 5    NonHDL 174.15   VITAMIN D 25 Hydroxy (Vit-D Deficiency, Fractures)  Result Value Ref Range   VITD 34.62 30.00 - 100.00 ng/mL  Hemoglobin A1c  Result Value Ref Range   Hgb A1c MFr Bld 5.6 4.6 - 6.5 %   Assessment and Plan:   Monique Carney was seen today for follow-up.  Diagnoses and all orders for this visit:  Morbid obesity ([place]) Comments: Monique Carney will continue to work on weight loss, exercise, and decreasing simple carbohydrates in her diet to help decrease the risk of diabetes.  Signs of hypothyroidism: none. Signs of hypercortisolism: none. Contraindications to weight loss: none. Patient readiness to commit to diet and activity changes: excellent. Barriers to weight loss: social factors (travel).  Plan: 1. Diagnostic studies to rule out secondary causes of obesity: completed as above. 2. General patient education ('Yes' if discussed, 'No' if not)  Obesity-related excess mortality declines with age and may disappear after age 78 (NEJM 49:1, 8): no  Weight loss has been proven to ameliorate risk factors for cardiac and other disease but no long-term studies of impact of weight loss on mortality as of 1998: yes  Average sustained weight loss in long-term studies w/lifestyle interventions alone is 10-15lb: yes  Importance of long-term maintenance tx in weight loss: yes  Use non-food self-rewards to reinforce behavior changes: yes  Elicit support from others; identify saboteurs: yes 3. Diet interventions:   Risks of dieting were reviewed, including fatigue, temporary hair loss, gallstone formation, gout, and with very low calorie diets, electrolyte abnormalities, nutrient  inadequacies, and loss of lean body mass. 4. Exercise intervention:   Informal measures, e.g. taking stairs instead of elevator: yes.  Formal exercise regimen: [employer] 5. Other behavioral treatment: stress management. 6. Other treatment: Medication: phentermine. 7. Patient to keep a weight log that we will review at follow up. 8. Follow up: 2 months and as needed.  Orders: -     metFORMIN (GLUCOPHAGE) 1000 MG tablet; Take 1  tablet (1,000 mg total) by mouth daily with breakfast. -     phentermine (ADIPEX-P) 37.5 MG tablet; Take 1 tablet (37.5 mg total) by mouth daily before breakfast.   . Reviewed expectations re: course of current medical issues. . Discussed self-management of symptoms. . Outlined signs and symptoms indicating need for more acute intervention. . Patient verbalized understanding and all questions were answered. Marland Kitchen Health Maintenance issues including appropriate healthy diet, exercise, and smoking avoidance were discussed with patient. . See orders for this visit as documented in the electronic medical record. . Patient received an After Visit Summary.  Records requested if needed. Time spent with the patient: 30 minutes, of which >50% was spent in obtaining information about her symptoms, reviewing her previous labs, evaluations, and treatments, counseling her about her condition (please see the discussed topics above), and developing a plan to further investigate it; she had a number of questions which I addressed.   Briscoe Deutscher, DO [place], [facility] 03/08/2017  Future Appointments Date Time Provider [place]  04/18/2017 7:45 AM Briscoe Deutscher, DO [facility] None  05/07/2017 7:30 AM Briscoe Deutscher, DO [facility] None

## 2017-03-08 DIAGNOSIS — E7800 Pure hypercholesterolemia, unspecified: Secondary | ICD-10-CM | POA: Insufficient documentation

## 2017-03-08 DIAGNOSIS — M0579 Rheumatoid arthritis with rheumatoid factor of multiple sites without organ or systems involvement: Secondary | ICD-10-CM | POA: Insufficient documentation

## 2017-03-26 DIAGNOSIS — Z23 Encounter for immunization: Secondary | ICD-10-CM | POA: Diagnosis not present

## 2017-04-08 DIAGNOSIS — M0589 Other rheumatoid arthritis with rheumatoid factor of multiple sites: Secondary | ICD-10-CM | POA: Diagnosis not present

## 2017-04-08 DIAGNOSIS — Z79899 Other long term (current) drug therapy: Secondary | ICD-10-CM | POA: Diagnosis not present

## 2017-04-18 ENCOUNTER — Ambulatory Visit: Payer: BLUE CROSS/BLUE SHIELD | Admitting: Family Medicine

## 2017-05-07 ENCOUNTER — Ambulatory Visit: Payer: BLUE CROSS/BLUE SHIELD | Admitting: Family Medicine

## 2017-06-06 ENCOUNTER — Encounter: Payer: Self-pay | Admitting: Family Medicine

## 2017-06-06 ENCOUNTER — Ambulatory Visit (INDEPENDENT_AMBULATORY_CARE_PROVIDER_SITE_OTHER): Payer: BLUE CROSS/BLUE SHIELD

## 2017-06-06 ENCOUNTER — Ambulatory Visit (INDEPENDENT_AMBULATORY_CARE_PROVIDER_SITE_OTHER): Payer: BLUE CROSS/BLUE SHIELD | Admitting: Family Medicine

## 2017-06-06 VITALS — BP 140/82 | HR 73 | Temp 97.6°F | Wt 226.8 lb

## 2017-06-06 DIAGNOSIS — R05 Cough: Secondary | ICD-10-CM

## 2017-06-06 DIAGNOSIS — R059 Cough, unspecified: Secondary | ICD-10-CM

## 2017-06-06 DIAGNOSIS — M0579 Rheumatoid arthritis with rheumatoid factor of multiple sites without organ or systems involvement: Secondary | ICD-10-CM

## 2017-06-06 MED ORDER — PHENTERMINE HCL 37.5 MG PO TABS: 37.5000 mg | ORAL_TABLET | Freq: Every day | ORAL | 2 refills | Status: DC

## 2017-06-06 MED ORDER — METFORMIN HCL 1000 MG PO TABS: 1000.0000 mg | ORAL_TABLET | Freq: Every day | ORAL | 1 refills | Status: DC

## 2017-06-07 ENCOUNTER — Encounter: Payer: Self-pay | Admitting: Family Medicine

## 2017-06-07 NOTE — Progress Notes (Signed)
Monique Carney is a 51 y.o. female is here for follow up.  History of Present Illness:   HPI: See Assessment and Plan section for Problem Based Charting of issues discussed today.  Health Maintenance Due  Topic Date Due  . COLONOSCOPY  11/15/2014   Depression screen PHQ 2/9 01/16/2017  Decreased Interest 0  Down, Depressed, Hopeless 0  PHQ - 2 Score 0   PMHx, SurgHx, SocialHx, FamHx, Medications, and Allergies were reviewed in the Visit Navigator and updated as appropriate.   Patient Active Problem List   Diagnosis Date Noted  . Pure hypercholesterolemia 03/08/2017  . RA (rheumatoid arthritis) ([place]) 03/08/2017  . Hypertension 01/16/2017  . Morbid obesity ([place]) 01/16/2017   Social History   Tobacco Use  . Smoking status: Former Smoker    Types: Cigarettes  . Smokeless tobacco: Never Used  . Tobacco comment: Smoked from ages [age] years old.   Substance Use Topics  . Alcohol use: Yes    Alcohol/week: 3.0 oz    Types: 5 Standard drinks or equivalent per week    Comment: 3-5 glasses of wine per week  . Drug use: No   Current Medications and Allergies:   .  methotrexate (RHEUMATREX) 2.5 MG tablet, TK 9 TS PO 1 TIME A WEEK, Disp: , Rfl: 0 .  naproxen (NAPROSYN) 500 MG tablet, Take 1 tablet by mouth daily., Disp: , Rfl: 1 .  traMADol (ULTRAM) 50 MG tablet, Take 1 tablet by mouth as needed., Disp: , Rfl: 0  No Known Allergies   Review of Systems   Pertinent items are noted in the HPI. Otherwise, ROS is negative.  Vitals:   Vitals:   06/06/17 1527  BP: 140/82  Pulse: 73  Temp: 97.6 F (36.4 C)  TempSrc: Oral  SpO2: 97%  Weight: 226 lb 12.8 oz (102.9 kg)     Body mass index is 40.43 kg/m.   Physical Exam:   Physical Exam  Constitutional: She appears well-developed and well-nourished. No distress.  HENT:  Head: Normocephalic and atraumatic.  Right Ear: External ear normal.  Left Ear: External ear normal.  Nose: Rhinorrhea present.  Mouth/Throat:  Oropharynx is clear and moist.  Eyes: Conjunctivae and EOM are normal. Pupils are equal, round, and reactive to light.  Neck: Normal range of motion. Neck supple.  Cardiovascular: Normal rate, regular rhythm, normal heart sounds and intact distal pulses.  Pulmonary/Chest: Effort normal. She has no wheezes. She has no rales.  Intermittent deep cough.  Abdominal: Soft.  Skin: Skin is warm.  Psychiatric: She has a normal mood and affect. Her behavior is normal.  Nursing note and vitals reviewed.   EXAM: CHEST  2 VIEW  FINDINGS: Heart and mediastinal contours are within normal limits. No focal opacities or effusions. No acute bony abnormality.  IMPRESSION: No active cardiopulmonary disease.  Assessment and Plan:   Blakely was seen today for cough.  Diagnoses and all orders for this visit:  Cough Comments: Chest x-ray today was reassuring.  During her exam, the patient did have multiple episodes of deep but nonproductive cough.  No wheezing on exam.  No red flags on questioning.  She denies any chest pain, shortness of breath, wheeze, weight loss or night sweats, smoking.  No history of asthma.  She has not done anything for treatment consistently.  She does notice that her cough is worse after she takes the methotrexate.  This makes me wonder if reflux is contributing.  Today, we provided an albuterol  treatment without much relief of symptoms.  Based on this, I have a lower suspicion for reactive airway disease.  I have instructed her to try Zantac and/or Zyrtec during the next 2-3 weeks.  If her cough is not improving, she will report back.  Okay pulmonary referral if patient requests. Orders: -     DG Chest 2 View; Future  Morbid obesity ([place]) Comments: The patient stopped both her metformin and Adipex prior to November.  She states that it was helping when she was motivated.  She would like to try this again.  She feels motivated.  She tolerated the medication previously well.  We  reviewed healthy eating practices and exercise.  Medications as below times 3 months.  Recheck in 3 months. Orders: -     metFORMIN (GLUCOPHAGE) 1000 MG tablet; Take 1 tablet (1,000 mg total) by mouth daily with breakfast. -     phentermine (ADIPEX-P) 37.5 MG tablet; Take 1 tablet (37.5 mg total) by mouth daily before breakfast.  Rheumatoid arthritis involving multiple sites with positive rheumatoid factor ([place]) Comments: Patient takes methotrexate for her rheumatoid arthritis.  She admits that this is difficult for her to take and that she lies down immediately after taking the medication.  Please see above for cough differential.  Records requested if needed. Time spent with the patient: 30 minutes, of which >50% was spent in obtaining information about her symptoms, reviewing her previous labs, evaluations, and treatments, counseling her about her condition (please see the discussed topics above), and developing a plan to further investigate it; she had a number of questions which I addressed.   . Reviewed expectations re: course of current medical issues. . Discussed self-management of symptoms. . Outlined signs and symptoms indicating need for more acute intervention. . Patient verbalized understanding and all questions were answered. Marland Kitchen Health Maintenance issues including appropriate healthy diet, exercise, and smoking avoidance were discussed with patient. . See orders for this visit as documented in the electronic medical record. . Patient received an After Visit Summary.  Briscoe Deutscher, DO [place], [facility] 06/07/2017  Future Appointments  Date Time Provider [place]  09/09/2017  4:00 PM Briscoe Deutscher, DO [facility]

## 2017-07-07 ENCOUNTER — Other Ambulatory Visit: Payer: Self-pay | Admitting: Family Medicine

## 2017-07-07 NOTE — Telephone Encounter (Signed)
MEDICATION:   PHARMACY:  [facility]   IS THIS A 90 DAY SUPPLY : no   IS PATIENT OUT OF MEDICATION:   IF NOT; HOW MUCH IS LEFT:   LAST APPOINTMENT DATE: @12 /14/2018  NEXT APPOINTMENT DATE:@3 /19/2019  OTHER COMMENTS:    **Let patient know to contact pharmacy at the end of the day to make sure medication is ready. **  ** Please notify patient to allow 48-72 hours to process**  **Encourage patient to contact the pharmacy for refills or they can request refills through [facility]**

## 2017-07-07 NOTE — Telephone Encounter (Signed)
Okay to get to appt

## 2017-07-08 ENCOUNTER — Telehealth: Payer: Self-pay | Admitting: Family Medicine

## 2017-07-08 DIAGNOSIS — E669 Obesity, unspecified: Secondary | ICD-10-CM | POA: Diagnosis not present

## 2017-07-08 DIAGNOSIS — M7060 Trochanteric bursitis, unspecified hip: Secondary | ICD-10-CM | POA: Diagnosis not present

## 2017-07-08 DIAGNOSIS — Z79899 Other long term (current) drug therapy: Secondary | ICD-10-CM | POA: Diagnosis not present

## 2017-07-08 DIAGNOSIS — M0589 Other rheumatoid arthritis with rheumatoid factor of multiple sites: Secondary | ICD-10-CM | POA: Diagnosis not present

## 2017-07-08 MED ORDER — PHENTERMINE HCL 37.5 MG PO TABS: 37.5000 mg | ORAL_TABLET | Freq: Every day | ORAL | 0 refills | Status: DC

## 2017-07-08 NOTE — Telephone Encounter (Signed)
I have pulled script from front and put in shred.

## 2017-07-08 NOTE — Telephone Encounter (Signed)
Copied from [place] [id number]. Topic: Quick Communication - See Telephone Encounter >> Jul 08, 2017 12:27 PM Vernona Rieger wrote: CRM for notification. See Telephone encounter for:   07/08/17.  Patient said she does not the need the script for phentermine (ADIPEX-P) 37.5 MG tablet, she said someone called her from the office to come and pick it up

## 2017-07-08 NOTE — Telephone Encounter (Signed)
Please see note below and advise regarding medication request.

## 2017-07-08 NOTE — Telephone Encounter (Signed)
Scripts printed put at reception for patient to pick up. L/m to let her know.

## 2017-07-12 DIAGNOSIS — Z1231 Encounter for screening mammogram for malignant neoplasm of breast: Secondary | ICD-10-CM | POA: Diagnosis not present

## 2017-09-09 ENCOUNTER — Encounter: Payer: Self-pay | Admitting: Family Medicine

## 2017-09-09 ENCOUNTER — Ambulatory Visit: Payer: BLUE CROSS/BLUE SHIELD | Admitting: Family Medicine

## 2017-09-09 VITALS — BP 140/78 | HR 105 | Temp 97.6°F | Wt 206.4 lb

## 2017-09-09 DIAGNOSIS — M0579 Rheumatoid arthritis with rheumatoid factor of multiple sites without organ or systems involvement: Secondary | ICD-10-CM | POA: Diagnosis not present

## 2017-09-09 DIAGNOSIS — E7800 Pure hypercholesterolemia, unspecified: Secondary | ICD-10-CM | POA: Diagnosis not present

## 2017-09-09 DIAGNOSIS — I10 Essential (primary) hypertension: Secondary | ICD-10-CM | POA: Diagnosis not present

## 2017-09-09 MED ORDER — PHENTERMINE HCL 37.5 MG PO TABS: 37.5000 mg | ORAL_TABLET | Freq: Every day | ORAL | 2 refills | Status: DC

## 2017-09-09 NOTE — Progress Notes (Deleted)
   Monique Carney is a 52 y.o. female is here for follow up.  History of Present Illness:   {CMA SCRIBE ATTESTATION}  HPI:   ROS  Health Maintenance Due  Topic Date Due  . COLONOSCOPY  11/15/2014   Depression screen PHQ 2/9 01/16/2017  Decreased Interest 0  Down, Depressed, Hopeless 0  PHQ - 2 Score 0   PMHx, SurgHx, SocialHx, FamHx, Medications, and Allergies were reviewed in the Visit Navigator and updated as appropriate.   Patient Active Problem List   Diagnosis Date Noted  . Pure hypercholesterolemia 03/08/2017  . RA (rheumatoid arthritis) ([place]) 03/08/2017  . Hypertension 01/16/2017  . Morbid obesity ([place]) 01/16/2017   Social History   Tobacco Use  . Smoking status: Former Smoker    Types: Cigarettes  . Smokeless tobacco: Never Used  . Tobacco comment: Smoked from ages [age] years old.   Substance Use Topics  . Alcohol use: Yes    Alcohol/week: 3.0 oz    Types: 5 Standard drinks or equivalent per week    Comment: 3-5 glasses of wine per week  . Drug use: No   Current Medications and Allergies:   Current Outpatient Medications:  .  Aspirin-Acetaminophen-Caffeine (EXCEDRIN PO), Take by mouth., Disp: , Rfl:  .  metFORMIN (GLUCOPHAGE) 1000 MG tablet, Take 1 tablet (1,000 mg total) by mouth daily with breakfast., Disp: 30 tablet, Rfl: 1 .  methotrexate (RHEUMATREX) 2.5 MG tablet, TK 9 TS PO 1 TIME A WEEK, Disp: , Rfl: 0 .  naproxen (NAPROSYN) 500 MG tablet, Take 1 tablet by mouth daily., Disp: , Rfl: 1 .  phentermine (ADIPEX-P) 37.5 MG tablet, TAKE 1 TABLET BY MOUTH DAILY BEFORE BREAKFAST, Disp: 30 tablet, Rfl: 0 .  phentermine (ADIPEX-P) 37.5 MG tablet, Take 1 tablet (37.5 mg total) by mouth daily before breakfast., Disp: 30 tablet, Rfl: 0 .  traMADol (ULTRAM) 50 MG tablet, Take 1 tablet by mouth as needed., Disp: , Rfl: 0  No Known Allergies   Review of Systems   Pertinent items are noted in the HPI. Otherwise, ROS is negative.  Vitals:  There were no  vitals filed for this visit.   There is no height or weight on file to calculate BMI.  Physical Exam:   Physical Exam   Assessment and Plan:   ***

## 2017-09-09 NOTE — Progress Notes (Signed)
Monique Carney is a 52 y.o. female is here for follow up.  History of Present Illness:   HPI: See Assessment and Plan section for Problem Based Charting of issues discussed today.   Review of Systems  Constitutional: Negative for chills, diaphoresis, fever, malaise/fatigue and weight loss.  HENT: Negative for hearing loss.   Eyes: Negative for blurred vision.  Respiratory: Negative for cough, shortness of breath and wheezing.   Cardiovascular: Negative for chest pain, palpitations and leg swelling.  Gastrointestinal: Negative for abdominal pain, constipation, diarrhea, heartburn, nausea and vomiting.  Genitourinary: Negative for dysuria, flank pain, frequency, hematuria and urgency.  Musculoskeletal: Negative for joint pain and myalgias.  Skin: Negative for rash.  Neurological: Negative for dizziness, weakness and headaches.  Psychiatric/Behavioral: Negative for depression, substance abuse and suicidal ideas. The patient is not nervous/anxious and does not have insomnia.    Health Maintenance Due  Topic Date Due  . COLONOSCOPY  11/15/2014   Depression screen PHQ 2/9 01/16/2017  Decreased Interest 0  Down, Depressed, Hopeless 0  PHQ - 2 Score 0   PMHx, SurgHx, SocialHx, FamHx, Medications, and Allergies were reviewed in the Visit Navigator and updated as appropriate.   Patient Active Problem List   Diagnosis Date Noted  . Pure hypercholesterolemia 03/08/2017  . RA (rheumatoid arthritis) ([place]) 03/08/2017  . Hypertension 01/16/2017  . Morbid obesity ([place]) 01/16/2017   Social History   Tobacco Use  . Smoking status: Former Smoker    Types: Cigarettes  . Smokeless tobacco: Never Used  . Tobacco comment: Smoked from ages [age] years old.   Substance Use Topics  . Alcohol use: Yes    Alcohol/week: 3.0 oz    Types: 5 Standard drinks or equivalent per week    Comment: 3-5 glasses of wine per week  . Drug use: No   Current Medications and Allergies:   Current Outpatient  Medications:  .  Aspirin-Acetaminophen-Caffeine (EXCEDRIN PO), Take by mouth., Disp: , Rfl:  .  methotrexate (RHEUMATREX) 2.5 MG tablet, TK 9 TS PO 1 TIME A WEEK, Disp: , Rfl: 0 .  naproxen (NAPROSYN) 500 MG tablet, Take 1 tablet by mouth daily., Disp: , Rfl: 1 .  phentermine (ADIPEX-P) 37.5 MG tablet, Take 1 tablet (37.5 mg total) by mouth daily before breakfast., Disp: 30 tablet, Rfl: 2 .  traMADol (ULTRAM) 50 MG tablet, Take 1 tablet by mouth as needed., Disp: , Rfl: 0  No Known Allergies   Review of Systems   Pertinent items are noted in the HPI. Otherwise, ROS is negative.  Vitals:   Vitals:   09/09/17 1609  BP: 140/78  Pulse: (!) 105  Temp: 97.6 F (36.4 C)  TempSrc: Oral  SpO2: 96%  Weight: 206 lb 6.4 oz (93.6 kg)     Body mass index is 36.8 kg/m.  Physical Exam:   Physical Exam  Constitutional: She is oriented to person, place, and time. She appears well-developed and well-nourished. No distress.  HENT:  Head: Normocephalic and atraumatic.  Right Ear: External ear normal.  Left Ear: External ear normal.  Nose: Nose normal.  Mouth/Throat: Oropharynx is clear and moist.  Eyes: Conjunctivae and EOM are normal. Pupils are equal, round, and reactive to light.  Neck: Normal range of motion. Neck supple. No thyromegaly present.  Cardiovascular: Normal rate, regular rhythm, normal heart sounds and intact distal pulses.  Pulmonary/Chest: Effort normal and breath sounds normal.  Abdominal: Soft. Bowel sounds are normal.  Musculoskeletal: Normal range of motion.  Lymphadenopathy:    She has no cervical adenopathy.  Neurological: She is alert and oriented to person, place, and time.  Skin: Skin is warm and dry. Capillary refill takes less than 2 seconds.  Psychiatric: She has a normal mood and affect. Her behavior is normal.  Nursing note and vitals reviewed.  Assessment and Plan:   Patient Active Problem List   Diagnosis Date Noted  . Pure hypercholesterolemia  03/08/2017  . Rheumatoid arthritis involving multiple sites with positive rheumatoid factor ([place]), on MTX 03/08/2017  . Hypertension, not on medication 01/16/2017  . Morbid obesity ([place]) 01/16/2017   Morbid obesity ([place]) Doing well on Phentermine. Not taking Metformin. She has changed her diet and is exercising regularly. Will continue current treatment. Recheck in 3 months.   Pure hypercholesterolemia LDL < 190. Working on weight loss. Low ASCVD. No statin. Recheck FLP at next visit.   Hypertension, not on medication Holding steady. The patient is asked to make an attempt to improve diet and exercise patterns to aid in medical management of this problem. No addition of medication.  Rheumatoid arthritis involving multiple sites with positive rheumatoid factor ([facility]), on MTX Pain improved with low sugar diet and weight loss.   Meds ordered this encounter  Medications  . phentermine (ADIPEX-P) 37.5 MG tablet    Sig: Take 1 tablet (37.5 mg total) by mouth daily before breakfast.    Dispense:  30 tablet    Refill:  2    . Reviewed expectations re: course of current medical issues. . Discussed self-management of symptoms. . Outlined signs and symptoms indicating need for more acute intervention. . Patient verbalized understanding and all questions were answered. Marland Kitchen Health Maintenance issues including appropriate healthy diet, exercise, and smoking avoidance were discussed with patient. . See orders for this visit as documented in the electronic medical record. . Patient received an After Visit Summary.  CMA served as Education administrator during this visit. History, Physical, and Plan performed by medical provider. The above documentation has been reviewed and is accurate and complete. Briscoe Deutscher, D.O.  Briscoe Deutscher, DO [place], [facility] 09/10/2017

## 2017-09-10 NOTE — Assessment & Plan Note (Signed)
Holding steady. The patient is asked to make an attempt to improve diet and exercise patterns to aid in medical management of this problem. No addition of medication.

## 2017-09-10 NOTE — Assessment & Plan Note (Signed)
Pain improved with low sugar diet and weight loss.

## 2017-09-10 NOTE — Assessment & Plan Note (Signed)
Doing well on Phentermine. Not taking Metformin. She has changed her diet and is exercising regularly. Will continue current treatment. Recheck in 3 months.

## 2017-09-10 NOTE — Assessment & Plan Note (Signed)
LDL < 190. Working on weight loss. Low ASCVD. No statin. Recheck FLP at next visit.

## 2017-10-08 DIAGNOSIS — M0589 Other rheumatoid arthritis with rheumatoid factor of multiple sites: Secondary | ICD-10-CM | POA: Diagnosis not present

## 2017-10-14 ENCOUNTER — Other Ambulatory Visit: Payer: Self-pay | Admitting: Family Medicine

## 2017-10-14 NOTE — Telephone Encounter (Signed)
Ok to fill 

## 2017-12-16 ENCOUNTER — Ambulatory Visit: Payer: BLUE CROSS/BLUE SHIELD | Admitting: Family Medicine

## 2018-01-06 DIAGNOSIS — M79642 Pain in left hand: Secondary | ICD-10-CM | POA: Diagnosis not present

## 2018-01-06 DIAGNOSIS — M0589 Other rheumatoid arthritis with rheumatoid factor of multiple sites: Secondary | ICD-10-CM | POA: Diagnosis not present

## 2018-01-06 DIAGNOSIS — M25571 Pain in right ankle and joints of right foot: Secondary | ICD-10-CM | POA: Diagnosis not present

## 2018-01-06 DIAGNOSIS — M79672 Pain in left foot: Secondary | ICD-10-CM | POA: Diagnosis not present

## 2018-01-06 DIAGNOSIS — Z79899 Other long term (current) drug therapy: Secondary | ICD-10-CM | POA: Diagnosis not present

## 2018-01-06 DIAGNOSIS — M79673 Pain in unspecified foot: Secondary | ICD-10-CM | POA: Diagnosis not present

## 2018-01-06 DIAGNOSIS — M79641 Pain in right hand: Secondary | ICD-10-CM | POA: Diagnosis not present

## 2018-01-06 DIAGNOSIS — M25572 Pain in left ankle and joints of left foot: Secondary | ICD-10-CM | POA: Diagnosis not present

## 2018-02-13 ENCOUNTER — Telehealth: Payer: Self-pay | Admitting: Family Medicine

## 2018-02-13 NOTE — Telephone Encounter (Signed)
Copied from [place] [id number]. Topic: Inquiry >> Feb 13, 2018  4:55 PM Mylinda Latina, NT wrote: Reason for CRM: Patient called and states that she needs a "qualification form" to be completed by her PCP. She thinks it is a health form for her insurance. She is wondering if Dr. Juleen China can order labs  a1c, cholesterol . She needs this done before the 30th of this month and also complete the form CB# [phone]

## 2018-02-13 NOTE — Telephone Encounter (Signed)
See note

## 2018-02-14 NOTE — Telephone Encounter (Signed)
Needs visit - will need to be with Inda Coke.

## 2018-02-14 NOTE — Telephone Encounter (Signed)
Do you want the patient to be seen?  

## 2018-02-16 NOTE — Telephone Encounter (Signed)
Tried to call patient. Mail box is full

## 2018-02-16 NOTE — Telephone Encounter (Signed)
Tried to call patient to schedule an appointment. If she calls back please schedule.

## 2018-02-16 NOTE — Telephone Encounter (Signed)
Mailbox full

## 2018-02-17 NOTE — Telephone Encounter (Signed)
Tried to call patient. No answer and mailbox is full.

## 2018-02-19 NOTE — Telephone Encounter (Signed)
Mail box is full not able to leave v/m  

## 2018-02-20 NOTE — Telephone Encounter (Signed)
Was not able to get in touch with patient.

## 2018-03-26 DIAGNOSIS — E669 Obesity, unspecified: Secondary | ICD-10-CM | POA: Diagnosis not present

## 2018-03-26 DIAGNOSIS — Z23 Encounter for immunization: Secondary | ICD-10-CM | POA: Diagnosis not present

## 2018-03-26 DIAGNOSIS — M0589 Other rheumatoid arthritis with rheumatoid factor of multiple sites: Secondary | ICD-10-CM | POA: Diagnosis not present

## 2018-03-26 DIAGNOSIS — R05 Cough: Secondary | ICD-10-CM | POA: Diagnosis not present

## 2018-03-26 DIAGNOSIS — Z79899 Other long term (current) drug therapy: Secondary | ICD-10-CM | POA: Diagnosis not present

## 2018-05-29 DIAGNOSIS — H26491 Other secondary cataract, right eye: Secondary | ICD-10-CM | POA: Diagnosis not present

## 2018-07-03 DIAGNOSIS — M0589 Other rheumatoid arthritis with rheumatoid factor of multiple sites: Secondary | ICD-10-CM | POA: Diagnosis not present

## 2018-07-03 DIAGNOSIS — R05 Cough: Secondary | ICD-10-CM | POA: Diagnosis not present

## 2018-07-03 DIAGNOSIS — Z13220 Encounter for screening for lipoid disorders: Secondary | ICD-10-CM | POA: Diagnosis not present

## 2018-07-03 DIAGNOSIS — Z79899 Other long term (current) drug therapy: Secondary | ICD-10-CM | POA: Diagnosis not present

## 2018-07-03 LAB — HEPATIC FUNCTION PANEL
ALT: 32 (ref 7–35)
AST: 51 — AB (ref 13–35)
Alkaline Phosphatase: 93 (ref 25–125)
Bilirubin, Total: 0.4

## 2018-07-03 LAB — BASIC METABOLIC PANEL
BUN: 10 (ref 4–21)
Creatinine: 0.8 (ref 0.5–1.1)
Glucose: 91
POTASSIUM: 4.7 (ref 3.4–5.3)
SODIUM: 142 (ref 137–147)

## 2018-07-03 LAB — CBC AND DIFFERENTIAL
HEMATOCRIT: 41 (ref 36–46)
HEMOGLOBIN: 13.4 (ref 12.0–16.0)
PLATELETS: 222 (ref 150–399)
WBC: 7.4

## 2018-07-03 LAB — LIPID PANEL
CHOLESTEROL: 181 (ref 0–200)
HDL: 46 (ref 35–70)
LDL Cholesterol: 111
Triglycerides: 122 (ref 40–160)

## 2018-07-03 LAB — POCT ERYTHROCYTE SEDIMENTATION RATE, NON-AUTOMATED: Sed Rate: 43

## 2018-07-10 ENCOUNTER — Encounter: Payer: Self-pay | Admitting: Physical Therapy

## 2018-08-04 DIAGNOSIS — H2512 Age-related nuclear cataract, left eye: Secondary | ICD-10-CM | POA: Diagnosis not present

## 2018-08-04 DIAGNOSIS — Z961 Presence of intraocular lens: Secondary | ICD-10-CM | POA: Diagnosis not present

## 2018-08-04 DIAGNOSIS — H18413 Arcus senilis, bilateral: Secondary | ICD-10-CM | POA: Diagnosis not present

## 2018-08-04 DIAGNOSIS — H26491 Other secondary cataract, right eye: Secondary | ICD-10-CM | POA: Diagnosis not present

## 2018-08-11 DIAGNOSIS — Z9841 Cataract extraction status, right eye: Secondary | ICD-10-CM | POA: Diagnosis not present

## 2018-08-15 ENCOUNTER — Encounter: Payer: Self-pay | Admitting: Family Medicine

## 2018-08-15 DIAGNOSIS — Z1231 Encounter for screening mammogram for malignant neoplasm of breast: Secondary | ICD-10-CM | POA: Diagnosis not present

## 2018-11-05 DIAGNOSIS — Z79899 Other long term (current) drug therapy: Secondary | ICD-10-CM | POA: Diagnosis not present

## 2018-11-05 DIAGNOSIS — M0589 Other rheumatoid arthritis with rheumatoid factor of multiple sites: Secondary | ICD-10-CM | POA: Diagnosis not present

## 2018-11-05 DIAGNOSIS — M1990 Unspecified osteoarthritis, unspecified site: Secondary | ICD-10-CM | POA: Diagnosis not present

## 2018-11-05 LAB — BASIC METABOLIC PANEL
BUN: 12 (ref 4–21)
Creatinine: 0.9 (ref 0.5–1.1)
Glucose: 92
Potassium: 4.7 (ref 3.4–5.3)
Sodium: 140 (ref 137–147)

## 2018-11-05 LAB — HEPATIC FUNCTION PANEL
ALT: 39 — AB (ref 7–35)
AST: 21 (ref 13–35)
Alkaline Phosphatase: 97 (ref 25–125)
Bilirubin, Total: 0.4

## 2018-11-05 LAB — CBC AND DIFFERENTIAL
HCT: 42 (ref 36–46)
Platelets: 217 (ref 150–399)
WBC: 7.5

## 2019-01-27 DIAGNOSIS — Z20828 Contact with and (suspected) exposure to other viral communicable diseases: Secondary | ICD-10-CM | POA: Diagnosis not present

## 2019-02-03 DIAGNOSIS — M1990 Unspecified osteoarthritis, unspecified site: Secondary | ICD-10-CM | POA: Diagnosis not present

## 2019-02-03 DIAGNOSIS — Z79899 Other long term (current) drug therapy: Secondary | ICD-10-CM | POA: Diagnosis not present

## 2019-02-03 DIAGNOSIS — M0589 Other rheumatoid arthritis with rheumatoid factor of multiple sites: Secondary | ICD-10-CM | POA: Diagnosis not present

## 2019-02-11 LAB — SED RATE MANUAL WEST: Sed Rate: 34

## 2019-05-06 DIAGNOSIS — Z79899 Other long term (current) drug therapy: Secondary | ICD-10-CM | POA: Diagnosis not present

## 2019-05-06 DIAGNOSIS — Z23 Encounter for immunization: Secondary | ICD-10-CM | POA: Diagnosis not present

## 2019-05-06 DIAGNOSIS — M1990 Unspecified osteoarthritis, unspecified site: Secondary | ICD-10-CM | POA: Diagnosis not present

## 2019-05-06 DIAGNOSIS — M0589 Other rheumatoid arthritis with rheumatoid factor of multiple sites: Secondary | ICD-10-CM | POA: Diagnosis not present

## 2019-06-01 DIAGNOSIS — H2512 Age-related nuclear cataract, left eye: Secondary | ICD-10-CM | POA: Diagnosis not present

## 2019-07-05 IMAGING — DX DG CHEST 2V
2 series · 2 of 2 positions shown · non-contrast
Comparison: 09/27/2014

CLINICAL DATA: Chronic productive cough for 6 months.

EXAM:
CHEST  2 VIEW

[chest pa]
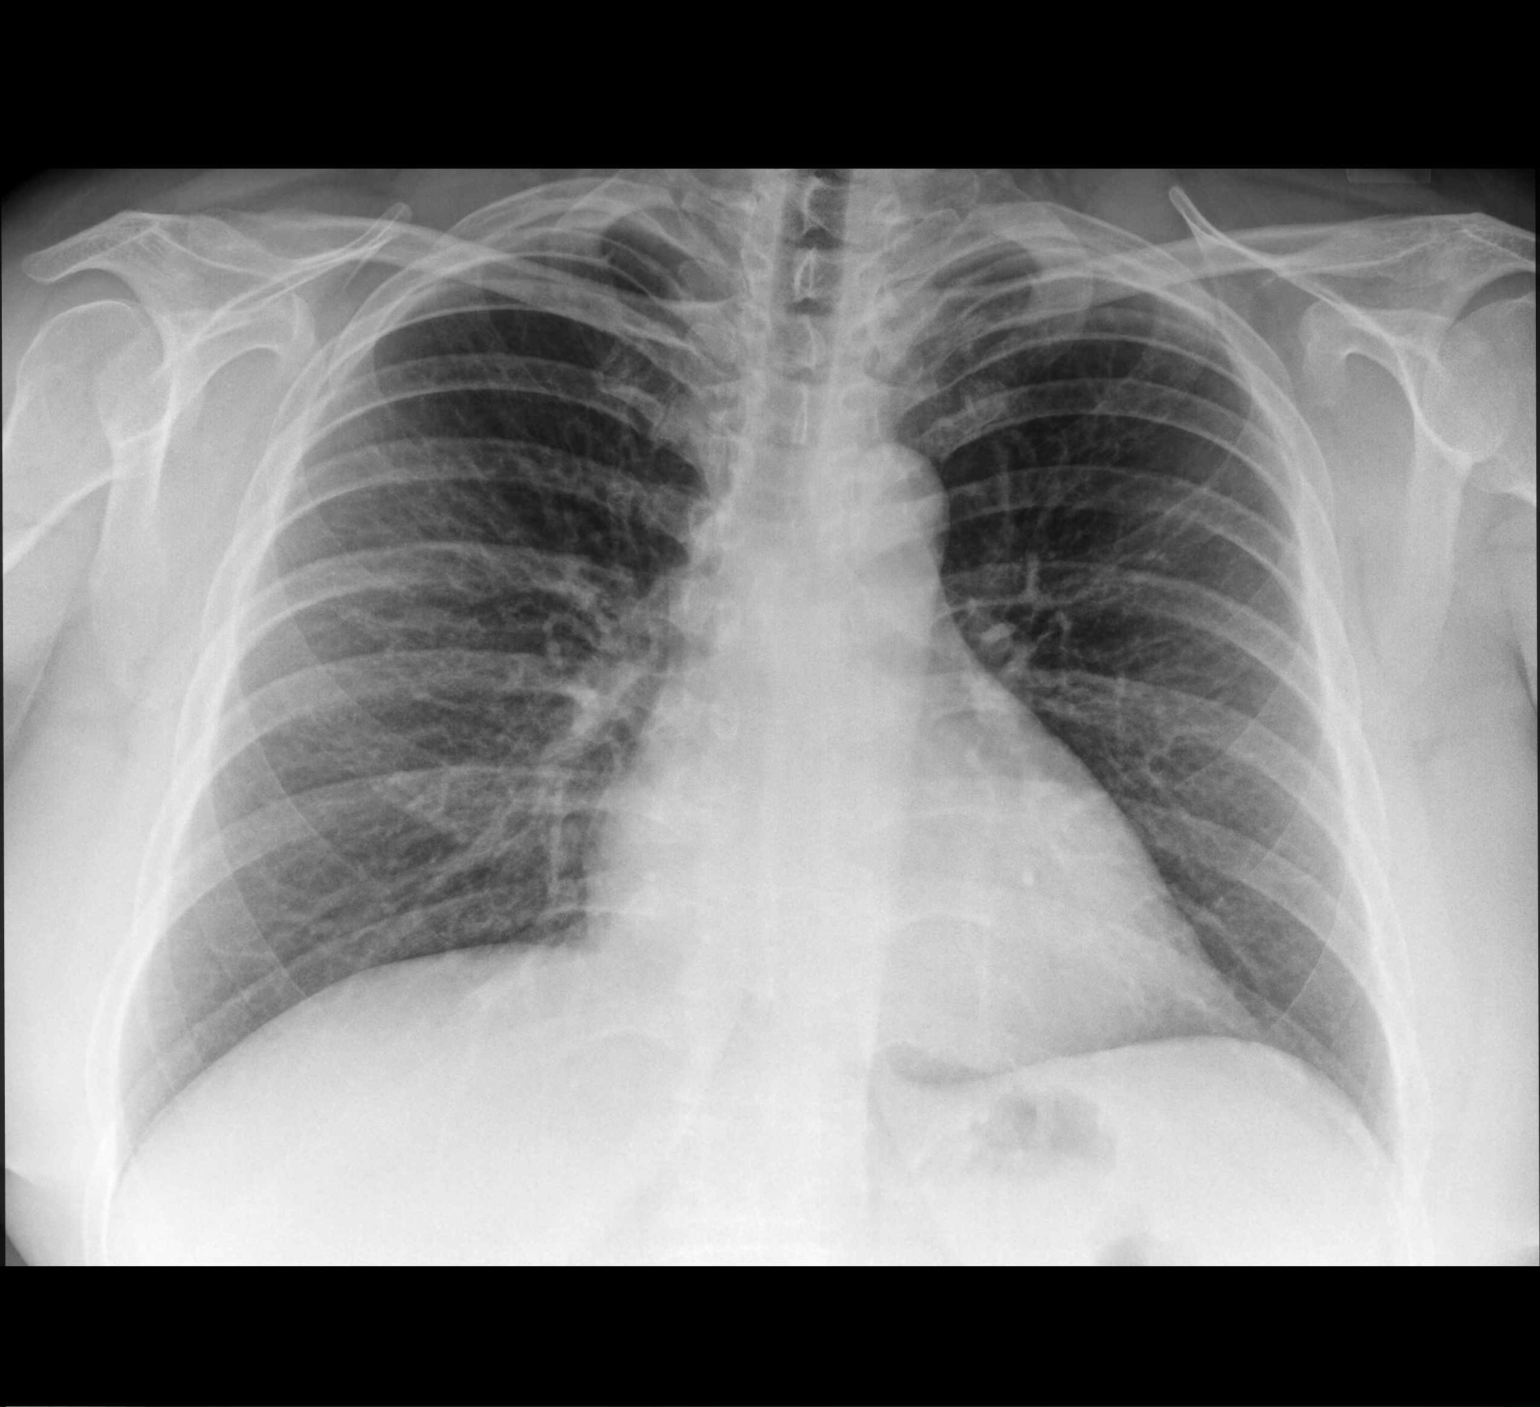

[chest lat]
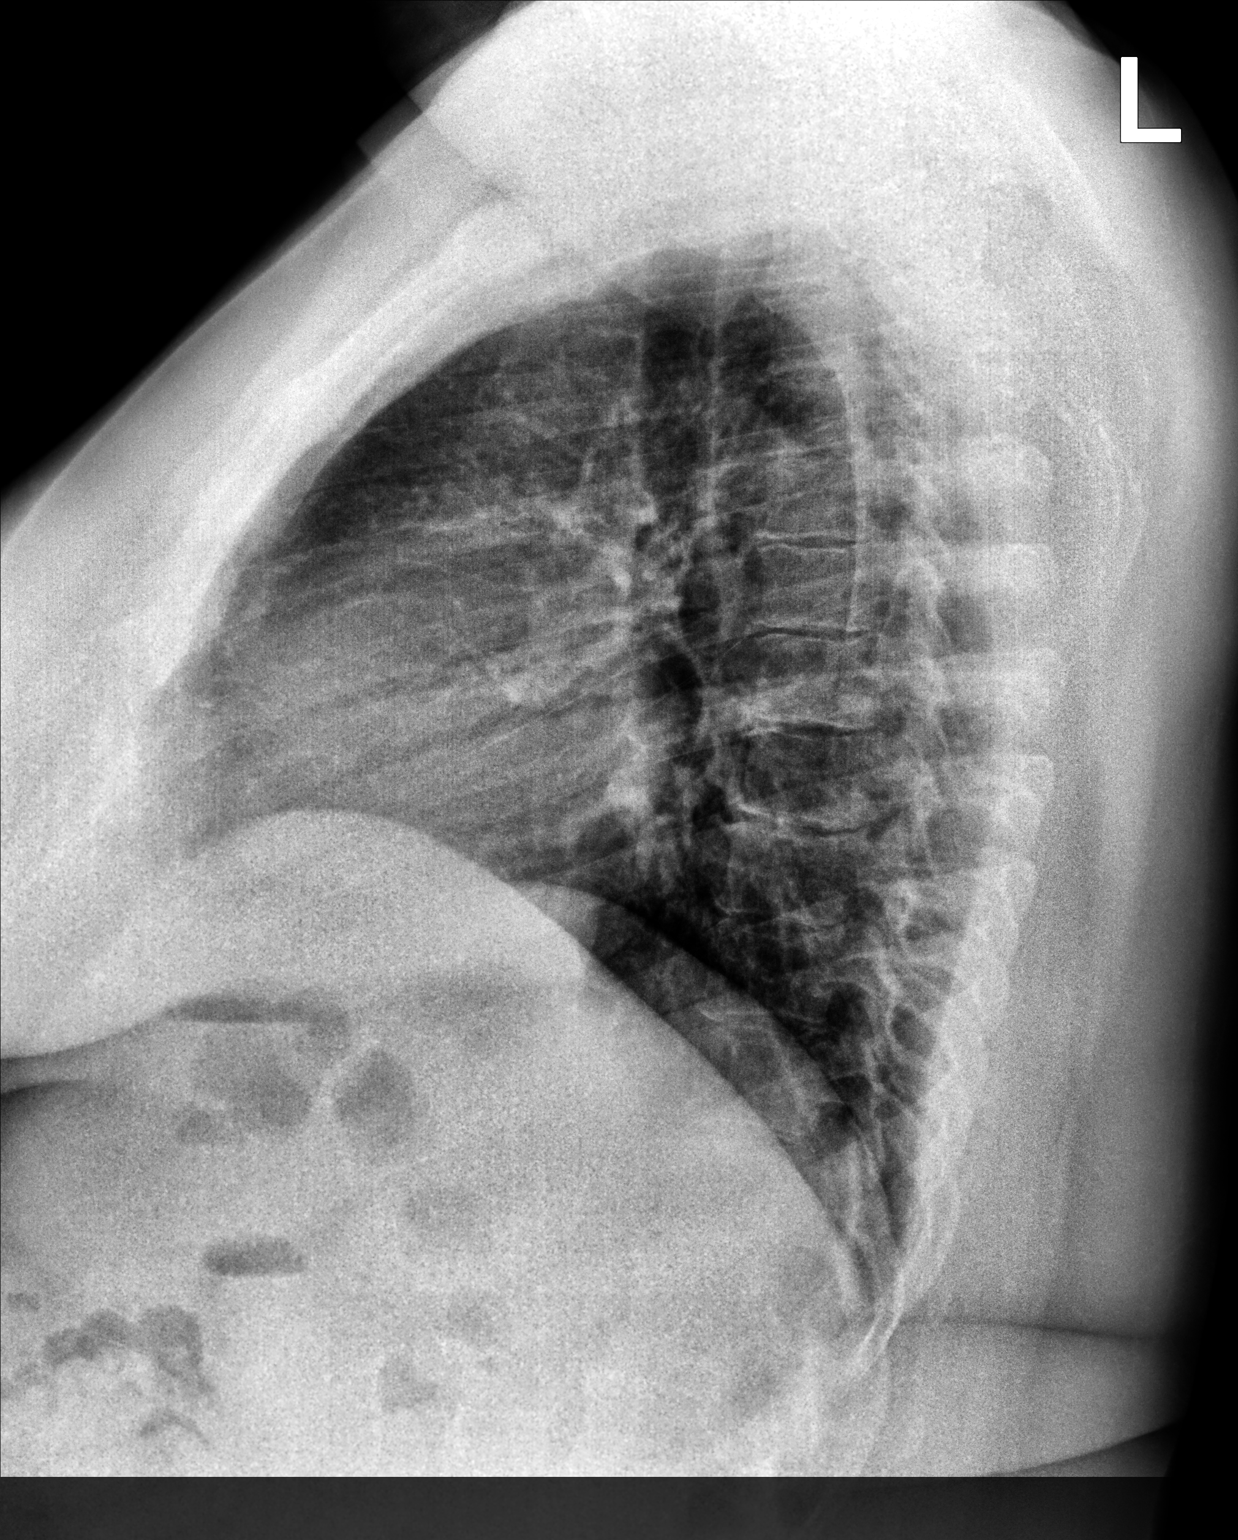

[2 of 2 positions shown; findings below may reference images not displayed]

FINDINGS: Heart and mediastinal contours are within normal limits. No focal
opacities or effusions. No acute bony abnormality.
IMPRESSION: No active cardiopulmonary disease.

## 2019-08-05 DIAGNOSIS — M1990 Unspecified osteoarthritis, unspecified site: Secondary | ICD-10-CM | POA: Diagnosis not present

## 2019-08-05 DIAGNOSIS — Z79899 Other long term (current) drug therapy: Secondary | ICD-10-CM | POA: Diagnosis not present

## 2019-08-05 DIAGNOSIS — M25562 Pain in left knee: Secondary | ICD-10-CM | POA: Diagnosis not present

## 2019-08-05 DIAGNOSIS — M0589 Other rheumatoid arthritis with rheumatoid factor of multiple sites: Secondary | ICD-10-CM | POA: Diagnosis not present

## 2019-08-05 DIAGNOSIS — M25561 Pain in right knee: Secondary | ICD-10-CM | POA: Diagnosis not present

## 2019-08-10 ENCOUNTER — Telehealth: Payer: Self-pay

## 2019-08-10 NOTE — Telephone Encounter (Signed)
Please call patient and set up appointment for [facility] if she would like to be seen in our office.

## 2019-09-02 ENCOUNTER — Ambulatory Visit: Payer: BC Managed Care – PPO | Attending: Internal Medicine

## 2019-09-02 DIAGNOSIS — Z23 Encounter for immunization: Secondary | ICD-10-CM

## 2019-09-02 NOTE — Progress Notes (Signed)
   Covid-19 Vaccination Clinic  Name:  Alishah Logemann    MRN: [id number] DOB: 1966-01-25  09/02/2019  Ms. Pennella was observed post Covid-19 immunization for 15 minutes without incident. She was provided with Vaccine Information Sheet and instruction to access the V-Safe system.   Ms. Wertzberger was instructed to call 911 with any severe reactions post vaccine: Marland Kitchen Difficulty breathing  . Swelling of face and throat  . A fast heartbeat  . A bad rash all over body  . Dizziness and weakness   Immunizations Administered    Name Date Dose VIS Date Route   Pfizer COVID-19 Vaccine 09/02/2019  4:20 PM 0.3 mL 06/04/2019 Intramuscular   Manufacturer: [place]   Lot: [id number]   [place]: [id number]

## 2019-09-08 ENCOUNTER — Encounter: Payer: Self-pay | Admitting: Family Medicine

## 2019-09-08 ENCOUNTER — Other Ambulatory Visit: Payer: Self-pay

## 2019-09-08 ENCOUNTER — Ambulatory Visit (INDEPENDENT_AMBULATORY_CARE_PROVIDER_SITE_OTHER): Payer: BC Managed Care – PPO | Admitting: Family Medicine

## 2019-09-08 VITALS — BP 136/88 | HR 86 | Temp 97.7°F | Ht 63.0 in | Wt 231.4 lb

## 2019-09-08 DIAGNOSIS — M0579 Rheumatoid arthritis with rheumatoid factor of multiple sites without organ or systems involvement: Secondary | ICD-10-CM

## 2019-09-08 DIAGNOSIS — Z1231 Encounter for screening mammogram for malignant neoplasm of breast: Secondary | ICD-10-CM

## 2019-09-08 DIAGNOSIS — I10 Essential (primary) hypertension: Secondary | ICD-10-CM

## 2019-09-08 DIAGNOSIS — Z8249 Family history of ischemic heart disease and other diseases of the circulatory system: Secondary | ICD-10-CM | POA: Insufficient documentation

## 2019-09-08 MED ORDER — LISINOPRIL 10 MG PO TABS: 10.0000 mg | ORAL_TABLET | Freq: Every day | ORAL | 3 refills | Status: DC

## 2019-09-08 NOTE — Patient Instructions (Signed)
Please return in 6-12 weeks for your annual complete physical and recheck blood pressure.  I will release your lab results to you on your MyChart account with further instructions. Please reply with any questions.   Please start the blood pressure pill daily. Let me know if you have any problems.   It was a pleasure meeting you today! Thank you for choosing [place] to meet your healthcare needs! I truly look forward to working with you. If you have any questions or concerns, please send me a message via Mychart or call the office at [phone].  I have ordered a mammogram and/or bone density for you as we discussed today: [x]   Mammogram  []   Bone Density  Please call the office checked below to schedule your appointment: Your appointment will at the following location  []   [facility] [place]      [place], [place]         [x]   [facility]  [facility] [place], [place]  Make sure to wear two peace clothing  No lotions powders or deodorants the day of the appointment Make sure to bring picture ID and insurance card.  Bring list of medications you are currently taking including any supplements.    Hypertension, Adult High blood pressure (hypertension) is when the force of blood pumping through the arteries is too strong. The arteries are the blood vessels that carry blood from the heart throughout the body. Hypertension forces the heart to work harder to pump blood and may cause arteries to become narrow or stiff. Untreated or uncontrolled hypertension can cause a heart attack, heart failure, a stroke, kidney disease, and other problems. A blood pressure reading consists of a higher number over a lower number. Ideally, your blood pressure should be below 120/80. The first ("top") number is called the systolic pressure. It is a measure of the pressure in your arteries as your heart beats. The second  ("bottom") number is called the diastolic pressure. It is a measure of the pressure in your arteries as the heart relaxes. What are the causes? The exact cause of this condition is not known. There are some conditions that result in or are related to high blood pressure. What increases the risk? Some risk factors for high blood pressure are under your control. The following factors may make you more likely to develop this condition:  Smoking.  Having type 2 diabetes mellitus, high cholesterol, or both.  Not getting enough exercise or physical activity.  Being overweight.  Having too much fat, sugar, calories, or salt (sodium) in your diet.  Drinking too much alcohol. Some risk factors for high blood pressure may be difficult or impossible to change. Some of these factors include:  Having chronic kidney disease.  Having a family history of high blood pressure.  Age. Risk increases with age.  Race. You may be at higher risk if you are African American.  Gender. Men are at higher risk than women before age 59. After age 49, women are at higher risk than men.  Having obstructive sleep apnea.  Stress. What are the signs or symptoms? High blood pressure may not cause symptoms. Very high blood pressure (hypertensive crisis) may cause:  Headache.  Anxiety.  Shortness of breath.  Nosebleed.  Nausea and vomiting.  Vision changes.  Severe chest pain.  Seizures. How  is this diagnosed? This condition is diagnosed by measuring your blood pressure while you are seated, with your arm resting on a flat surface, your legs uncrossed, and your feet flat on the floor. The cuff of the blood pressure monitor will be placed directly against the skin of your upper arm at the level of your heart. It should be measured at least twice using the same arm. Certain conditions can cause a difference in blood pressure between your right and left arms. Certain factors can cause blood pressure  readings to be lower or higher than normal for a short period of time:  When your blood pressure is higher when you are in a health care provider's office than when you are at home, this is called white coat hypertension. Most people with this condition do not need medicines.  When your blood pressure is higher at home than when you are in a health care provider's office, this is called masked hypertension. Most people with this condition may need medicines to control blood pressure. If you have a high blood pressure reading during one visit or you have normal blood pressure with other risk factors, you may be asked to:  Return on a different day to have your blood pressure checked again.  Monitor your blood pressure at home for 1 week or longer. If you are diagnosed with hypertension, you may have other blood or imaging tests to help your health care provider understand your overall risk for other conditions. How is this treated? This condition is treated by making healthy lifestyle changes, such as eating healthy foods, exercising more, and reducing your alcohol intake. Your health care provider may prescribe medicine if lifestyle changes are not enough to get your blood pressure under control, and if:  Your systolic blood pressure is above 130.  Your diastolic blood pressure is above 80. Your personal target blood pressure may vary depending on your medical conditions, your age, and other factors. Follow these instructions at home: Eating and drinking   Eat a diet that is high in fiber and potassium, and low in sodium, added sugar, and fat. An example eating plan is called the DASH (Dietary Approaches to Stop Hypertension) diet. To eat this way: ? Eat plenty of fresh fruits and vegetables. Try to fill one half of your plate at each meal with fruits and vegetables. ? Eat whole grains, such as whole-wheat pasta, brown rice, or whole-grain bread. Fill about one fourth of your plate with whole  grains. ? Eat or drink low-fat dairy products, such as skim milk or low-fat yogurt. ? Avoid fatty cuts of meat, processed or cured meats, and poultry with skin. Fill about one fourth of your plate with lean proteins, such as fish, chicken without skin, beans, eggs, or tofu. ? Avoid pre-made and processed foods. These tend to be higher in sodium, added sugar, and fat.  Reduce your daily sodium intake. Most people with hypertension should eat less than 1,500 mg of sodium a day.  Do not drink alcohol if: ? Your health care provider tells you not to drink. ? You are pregnant, may be pregnant, or are planning to become pregnant.  If you drink alcohol: ? Limit how much you use to:  0-1 drink a day for women.  0-2 drinks a day for men. ? Be aware of how much alcohol is in your drink. In the [place] one drink equals one 12 oz bottle of beer (355 mL), one 5 oz glass of wine (  148 mL), or one 1 oz glass of hard liquor (44 mL). Lifestyle   Work with your health care provider to maintain a healthy body weight or to lose weight. Ask what an ideal weight is for you.  Get at least 30 minutes of exercise most days of the week. Activities may include walking, swimming, or biking.  Include exercise to strengthen your muscles (resistance exercise), such as Pilates or lifting weights, as part of your weekly exercise routine. Try to do these types of exercises for 30 minutes at least 3 days a week.  Do not use any products that contain nicotine or tobacco, such as cigarettes, e-cigarettes, and chewing tobacco. If you need help quitting, ask your health care provider.  Monitor your blood pressure at home as told by your health care provider.  Keep all follow-up visits as told by your health care provider. This is important. Medicines  Take over-the-counter and prescription medicines only as told by your health care provider. Follow directions carefully. Blood pressure medicines must be taken as  prescribed.  Do not skip doses of blood pressure medicine. Doing this puts you at risk for problems and can make the medicine less effective.  Ask your health care provider about side effects or reactions to medicines that you should watch for. Contact a health care provider if you:  Think you are having a reaction to a medicine you are taking.  Have headaches that keep coming back (recurring).  Feel dizzy.  Have swelling in your ankles.  Have trouble with your vision. Get help right away if you:  Develop a severe headache or confusion.  Have unusual weakness or numbness.  Feel faint.  Have severe pain in your chest or abdomen.  Vomit repeatedly.  Have trouble breathing. Summary  Hypertension is when the force of blood pumping through your arteries is too strong. If this condition is not controlled, it may put you at risk for serious complications.  Your personal target blood pressure may vary depending on your medical conditions, your age, and other factors. For most people, a normal blood pressure is less than 120/80.  Hypertension is treated with lifestyle changes, medicines, or a combination of both. Lifestyle changes include losing weight, eating a healthy, low-sodium diet, exercising more, and limiting alcohol. This information is not intended to replace advice given to you by your health care provider. Make sure you discuss any questions you have with your health care provider. Document Revised: 02/18/2018 Document Reviewed: 02/18/2018 Elsevier Patient Education  [place] and Cholesterol Restricted Eating Plan Getting too much fat and cholesterol in your diet may cause health problems. Choosing the right foods helps keep your fat and cholesterol at normal levels. This can keep you from getting certain diseases. Your doctor may recommend an eating plan that includes:  Total fat: ______% or less of total calories a day.  Saturated fat: ______% or  less of total calories a day.  Cholesterol: less than _________mg a day.  Fiber: ______g a day. What are tips for following this plan? Meal planning  At meals, divide your plate into four equal parts: ? Fill one-half of your plate with vegetables and [place] salads. ? Fill one-fourth of your plate with whole grains. ? Fill one-fourth of your plate with low-fat (lean) protein foods.  Eat fish that is high in omega-3 fats at least two times a week. This includes mackerel, tuna, sardines, and salmon.  Eat foods that are high in fiber,  such as whole grains, beans, apples, broccoli, carrots, peas, and barley. General tips   Work with your doctor to lose weight if you need to.  Avoid: ? Foods with added sugar. ? Fried foods. ? Foods with partially hydrogenated oils.  Limit alcohol intake to no more than 1 drink a day for nonpregnant women and 2 drinks a day for men. One drink equals 12 oz of beer, 5 oz of wine, or 1 oz of hard liquor. Reading food labels  Check food labels for: ? Trans fats. ? Partially hydrogenated oils. ? Saturated fat (g) in each serving. ? Cholesterol (mg) in each serving. ? Fiber (g) in each serving.  Choose foods with healthy fats, such as: ? Monounsaturated fats. ? Polyunsaturated fats. ? Omega-3 fats.  Choose grain products that have whole grains. Look for the word "whole" as the first word in the ingredient list. Cooking  Cook foods using low-fat methods. These include baking, boiling, grilling, and broiling.  Eat more home-cooked foods. Eat at restaurants and buffets less often.  Avoid cooking using saturated fats, such as butter, cream, palm oil, palm kernel oil, and coconut oil. Recommended foods  Fruits  All fresh, canned (in natural juice), or frozen fruits. Vegetables  Fresh or frozen vegetables (raw, steamed, roasted, or grilled). [place] salads. Grains  Whole grains, such as whole wheat or whole grain breads, crackers, cereals, and  pasta. Unsweetened oatmeal, bulgur, barley, quinoa, or brown rice. Corn or whole wheat flour tortillas. Meats and other protein foods  Ground beef (85% or leaner), grass-fed beef, or beef trimmed of fat. Skinless chicken or [place]. Ground chicken or [place]. Pork trimmed of fat. All fish and seafood. Egg whites. Dried beans, peas, or lentils. Unsalted nuts or seeds. Unsalted canned beans. Nut butters without added sugar or oil. Dairy  Low-fat or nonfat dairy products, such as skim or 1% milk, 2% or reduced-fat cheeses, low-fat and fat-free ricotta or cottage cheese, or plain low-fat and nonfat yogurt. Fats and oils  Tub margarine without trans fats. Light or reduced-fat mayonnaise and salad dressings. Avocado. Olive, canola, sesame, or safflower oils. The items listed above may not be a complete list of foods and beverages you can eat. Contact a dietitian for more information. Foods to avoid Fruits  Canned fruit in heavy syrup. Fruit in cream or butter sauce. Fried fruit. Vegetables  Vegetables cooked in cheese, cream, or butter sauce. Fried vegetables. Grains  White bread. White pasta. White rice. Cornbread. Bagels, pastries, and croissants. Crackers and snack foods that contain trans fat and hydrogenated oils. Meats and other protein foods  Fatty cuts of meat. Ribs, chicken wings, bacon, sausage, bologna, salami, chitterlings, fatback, hot dogs, bratwurst, and packaged lunch meats. Liver and organ meats. Whole eggs and egg yolks. Chicken and [place] with skin. Fried meat. Dairy  Whole or 2% milk, cream, half-and-half, and cream cheese. Whole milk cheeses. Whole-fat or sweetened yogurt. Full-fat cheeses. Nondairy creamers and whipped toppings. Processed cheese, cheese spreads, and cheese curds. Beverages  Alcohol. Sugar-sweetened drinks such as sodas, lemonade, and fruit drinks. Fats and oils  Butter, stick margarine, lard, shortening, ghee, or bacon fat. Coconut, palm kernel, and palm  oils. Sweets and desserts  Corn syrup, sugars, honey, and molasses. Candy. Jam and jelly. Syrup. Sweetened cereals. Cookies, pies, cakes, donuts, muffins, and ice cream. The items listed above may not be a complete list of foods and beverages you should avoid. Contact a dietitian for more information. Summary  Choosing the right foods  helps keep your fat and cholesterol at normal levels. This can keep you from getting certain diseases.  At meals, fill one-half of your plate with vegetables and Wienke salads.  Eat high-fiber foods, like whole grains, beans, apples, carrots, peas, and barley.  Limit added sugar, saturated fats, alcohol, and fried foods. This information is not intended to replace advice given to you by your health care provider. Make sure you discuss any questions you have with your health care provider. Document Revised: 02/11/2018 Document Reviewed: 02/25/2017 Elsevier Patient Education  [place].

## 2019-09-08 NOTE — Progress Notes (Signed)
Subjective  CC:  Chief Complaint  Patient presents with  . Transitions Of Care    TOC from Dr. Juleen China  . Hyperlipidemia    does not take any medication for this  . Hypertension    Dr. Dossie Der is concerned about bp. No visual changes. Weekly HA's    HPI: Monique Carney is a 54 y.o. female who presents to [facility] [place] today to establish care with me as a new patient.   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 New dx. No exertional cp, sob, DOE, palpitations, LE edema, orthopnea. She struggles with weight. + Fam h/o premature CAD: dad in his early 64s. Brother has HTN. She feels well. Admits to GERD sxs. No sxs of hyperglycemia. Reports h/o borderline cholesterol levels in the past w/o RX.   RA managed and controlled with methotrexate.   Obesity: has used phentermine in the past. Goes up and down. Hasn't been working on weight loss recently.   HM: overdue for cpe, crc screen, mammo, pap and labs.   BP Readings from Last 3 Encounters:  09/08/19 136/88  09/09/17 140/78  06/06/17 140/82   Wt Readings from Last 3 Encounters:  09/08/19 231 lb 6.4 oz (105 kg)  09/09/17 206 lb 6.4 oz (93.6 kg)  06/06/17 226 lb 12.8 oz (102.9 kg)    Assessment  1. Essential hypertension   2. Rheumatoid arthritis involving multiple sites with positive rheumatoid factor ([place]), on MTX   3. Morbid obesity ([place])   4. Family history of premature CAD   5. Encounter for screening mammogram for breast cancer      Plan   HTN: education and counseling done. Start lisinopril and check labs. Discussed importance of keeping BP normal. Risk assess with lipids and sugar screen today.   Work on weight loss; can consider adjuvant meds to help once bp is controlled.   Pt to schedule mammogram will update pap and discuss crc screen at cpe.   Follow  up:  cpe in 6-12 weeks. And f/u bp Orders Placed This Encounter  Procedures  . MM DIGITAL SCREENING BILATERAL  . CBC with Differential/Platelet  . Comprehensive metabolic panel  . Hemoglobin A1c  . TSH  . Lipid panel   Meds ordered this encounter  Medications  . lisinopril (ZESTRIL) 10 MG tablet    Sig: Take 1 tablet (10 mg total) by mouth daily.    Dispense:  90 tablet    Refill:  3     Depression screen [facility] 2/9 09/08/2019 09/10/2017 01/16/2017  Decreased Interest 0 0 0  Down, Depressed, Hopeless 0 0 0  PHQ - 2 Score 0 0 0    We updated and reviewed the patient's past history in detail and it is documented below.  Patient Active Problem List   Diagnosis Date Noted  . Family history of premature CAD 09/08/2019  . Rheumatoid arthritis involving multiple sites with positive rheumatoid factor ([place]), on MTX 03/08/2017    Followed by Dr. Dossie Der Rheumatoid arthritis-CCP+, RF+, ANA 1:160, Difuse Synovitis of hands, feet, wrist, non erosive.    . Essential hypertension 01/16/2017  . Morbid obesity ([place]) 01/16/2017   Health Maintenance  Topic Date Due  . Samul Dada  06/24/2012  . COLONOSCOPY  Never done  . PAP SMEAR-Modifier  09/29/2018  . MAMMOGRAM  08/16/2019  .  INFLUENZA VACCINE  09/22/2019 (Originally 01/23/2019)  . HIV Screening  Discontinued   Immunization History  Administered Date(s) Administered  . PFIZER SARS-COV-2 Vaccination 09/02/2019  . Tetanus 06/24/2002   Current Meds  Medication Sig  . folic acid (FOLVITE) 1 MG tablet Take 1 mg by mouth daily.  . methotrexate (RHEUMATREX) 2.5 MG tablet TK 9 TS PO 1 TIME A WEEK  . Multiple Vitamins-Minerals (MULTIVITAMIN GUMMIES ADULT PO) Take by mouth.  . naproxen (NAPROSYN) 500 MG tablet Take 1 tablet by mouth daily.  . traMADol (ULTRAM) 50 MG tablet Take 1 tablet by mouth as needed.    Allergies: Patient has No Known Allergies. Past Medical History Patient  has a past medical history of Bartholin's gland abscess,  right, Condyloma acuminatum of perianal region, Hypercholesteremia, Hypertension, Meningitis spinal, and Rheumatoid arthritis ([place]). Past Surgical History Patient  has a past surgical history that includes Cataract extraction (Right); perirectal biopsy (02-19-05); and Tooth extraction. Family History: Patient family history includes CAD (age of onset: 79) in her father; Hypertension in her brother. Social History:  Patient  reports that she has quit smoking. Her smoking use included cigarettes. She has never used smokeless tobacco. She reports current alcohol use of about 5.0 standard drinks of alcohol per week. She reports that she does not use drugs.  Review of Systems: Constitutional: negative for fever or malaise Ophthalmic: negative for photophobia, double vision or loss of vision Cardiovascular: negative for chest pain, dyspnea on exertion, or new LE swelling Respiratory: negative for SOB or persistent cough Gastrointestinal: negative for change in bowel habits or melena, occ reflux sxs relieved with OTC meds Genitourinary: negative for dysuria or gross hematuria Musculoskeletal: negative for new gait disturbance or muscular weakness Integumentary: negative for new or persistent rashes Neurological: negative for TIA or stroke symptoms Psychiatric: negative for SI or delusions Allergic/Immunologic: negative for hives  Patient Care Team    Relationship Specialty Notifications Start End  Leamon Arnt, MD PCP - General Family Medicine  09/08/19   Valinda Party, MD  Rheumatology  07/10/18     Objective  Vitals: BP 136/88 (BP Location: Left Arm, Patient Position: Sitting, Cuff Size: Large)   Pulse 86   Temp 97.7 F (36.5 C) (Temporal)   Ht 5\' 3"  (1.6 m)   Wt 231 lb 6.4 oz (105 kg)   LMP 04/25/2011   SpO2 98%   BMI 40.99 kg/m  General:  Well developed, well nourished, no acute distress  Psych:  Alert and oriented,normal mood and affect HEENT:  Normocephalic, atraumatic,  non-icteric sclera, supple neck without adenopathy, mass or thyromegaly Cardiovascular:  RRR without gallop, rub or murmur Respiratory:  Good breath sounds bilaterally, CTAB with normal respiratory effort   Commons side effects, risks, benefits, and alternatives for medications and treatment plan prescribed today were discussed, and the patient expressed understanding of the given instructions. Patient is instructed to call or message via MyChart if he/she has any questions or concerns regarding our treatment plan. No barriers to understanding were identified. We discussed Red Flag symptoms and signs in detail. Patient expressed understanding regarding what to do in case of urgent or emergency type symptoms.   Medication list was reconciled, printed and provided to the patient in AVS. Patient instructions and summary information was reviewed with the patient as documented in the AVS. This note was prepared with assistance of Dragon voice recognition software. Occasional wrong-word or sound-a-like substitutions may have occurred due to the inherent limitations of voice recognition software  This visit occurred during the SARS-CoV-2 public health emergency.  Safety protocols were in place, including screening questions prior to the visit, additional usage of staff PPE, and extensive cleaning of exam room while observing appropriate contact time as indicated for disinfecting solutions.

## 2019-09-09 LAB — LIPID PANEL
Cholesterol: 204 mg/dL — ABNORMAL HIGH (ref 0–200)
HDL: 44.3 mg/dL (ref >39.00)
LDL Cholesterol: 128 mg/dL — ABNORMAL HIGH (ref 0–99)
NonHDL: 159.71
Total CHOL/HDL Ratio: 5
Triglycerides: 161 mg/dL — ABNORMAL HIGH (ref 0.0–149.0)
VLDL: 32.2 mg/dL (ref 0.0–40.0)

## 2019-09-09 LAB — COMPREHENSIVE METABOLIC PANEL
ALT: 21 U/L (ref 0–35)
AST: 17 U/L (ref 0–37)
Albumin: 3.8 g/dL (ref 3.5–5.2)
Alkaline Phosphatase: 83 U/L (ref 39–117)
BUN: 10 mg/dL (ref 6–23)
CO2: 27 mEq/L (ref 19–32)
Calcium: 9 mg/dL (ref 8.4–10.5)
Chloride: 106 mEq/L (ref 96–112)
Creatinine, Ser: 0.75 mg/dL (ref 0.40–1.20)
GFR: 80.28 mL/min (ref >60.00)
Glucose, Bld: 91 mg/dL (ref 70–99)
Potassium: 4.4 mEq/L (ref 3.5–5.1)
Sodium: 140 mEq/L (ref 135–145)
Total Bilirubin: 0.3 mg/dL (ref 0.2–1.2)
Total Protein: 7.3 g/dL (ref 6.0–8.3)

## 2019-09-09 LAB — CBC WITH DIFFERENTIAL/PLATELET
Basophils Absolute: 0.1 10*3/uL (ref 0.0–0.1)
Basophils Relative: 1.1 % (ref 0.0–3.0)
Eosinophils Absolute: 0.2 10*3/uL (ref 0.0–0.7)
Eosinophils Relative: 4.3 % (ref 0.0–5.0)
HCT: 39.7 % (ref 36.0–46.0)
Hemoglobin: 13.3 g/dL (ref 12.0–15.0)
Lymphocytes Relative: 27.6 % (ref 12.0–46.0)
Lymphs Abs: 1.5 10*3/uL (ref 0.7–4.0)
MCHC: 33.5 g/dL (ref 30.0–36.0)
MCV: 95.6 fl (ref 78.0–100.0)
Monocytes Absolute: 0.5 10*3/uL (ref 0.1–1.0)
Monocytes Relative: 8.6 % (ref 3.0–12.0)
Neutro Abs: 3.3 10*3/uL (ref 1.4–7.7)
Neutrophils Relative %: 58.4 % (ref 43.0–77.0)
Platelets: 221 10*3/uL (ref 150.0–400.0)
RBC: 4.15 Mil/uL (ref 3.87–5.11)
RDW: 14.2 % (ref 11.5–15.5)
WBC: 5.6 10*3/uL (ref 4.0–10.5)

## 2019-09-09 LAB — HEMOGLOBIN A1C: Hgb A1c MFr Bld: 5.6 % (ref 4.6–6.5)

## 2019-09-09 LAB — TSH: TSH: 1.78 u[IU]/mL (ref 0.35–4.50)

## 2019-09-27 ENCOUNTER — Ambulatory Visit: Payer: BC Managed Care – PPO | Attending: Internal Medicine

## 2019-09-27 DIAGNOSIS — Z23 Encounter for immunization: Secondary | ICD-10-CM

## 2019-09-27 NOTE — Progress Notes (Signed)
   Covid-19 Vaccination Clinic  Name:  Monique Carney    MRN: [id number] DOB: Nov 04, 1965  09/27/2019  Monique Carney was observed post Covid-19 immunization for 15 minutes without incident. She was provided with Vaccine Information Sheet and instruction to access the V-Safe system.   Monique Carney was instructed to call 911 with any severe reactions post vaccine: Marland Kitchen Difficulty breathing  . Swelling of face and throat  . A fast heartbeat  . A bad rash all over body  . Dizziness and weakness   Immunizations Administered    Name Date Dose VIS Date Route   Pfizer COVID-19 Vaccine 09/27/2019  4:30 PM 0.3 mL 06/04/2019 Intramuscular   Manufacturer: [place]   Lot: [id number]   [place]: [id number]

## 2019-11-02 DIAGNOSIS — Z79899 Other long term (current) drug therapy: Secondary | ICD-10-CM | POA: Diagnosis not present

## 2019-11-02 DIAGNOSIS — M1990 Unspecified osteoarthritis, unspecified site: Secondary | ICD-10-CM | POA: Diagnosis not present

## 2019-11-02 DIAGNOSIS — M0589 Other rheumatoid arthritis with rheumatoid factor of multiple sites: Secondary | ICD-10-CM | POA: Diagnosis not present

## 2019-11-03 ENCOUNTER — Ambulatory Visit: Payer: BC Managed Care – PPO | Admitting: Family Medicine

## 2019-12-10 ENCOUNTER — Ambulatory Visit: Payer: BC Managed Care – PPO | Admitting: Family Medicine

## 2019-12-17 ENCOUNTER — Ambulatory Visit: Payer: BC Managed Care – PPO | Admitting: Family Medicine

## 2019-12-24 ENCOUNTER — Encounter: Payer: Self-pay | Admitting: Family Medicine

## 2019-12-24 ENCOUNTER — Other Ambulatory Visit: Payer: Self-pay

## 2019-12-24 ENCOUNTER — Ambulatory Visit: Payer: BC Managed Care – PPO | Admitting: Family Medicine

## 2019-12-24 VITALS — BP 144/90 | HR 90 | Temp 97.2°F | Ht 63.0 in | Wt 224.0 lb

## 2019-12-24 DIAGNOSIS — Z8249 Family history of ischemic heart disease and other diseases of the circulatory system: Secondary | ICD-10-CM | POA: Diagnosis not present

## 2019-12-24 DIAGNOSIS — I10 Essential (primary) hypertension: Secondary | ICD-10-CM

## 2019-12-24 DIAGNOSIS — M0579 Rheumatoid arthritis with rheumatoid factor of multiple sites without organ or systems involvement: Secondary | ICD-10-CM

## 2019-12-24 MED ORDER — LISINOPRIL-HYDROCHLOROTHIAZIDE 20-12.5 MG PO TABS: 1.0000 | ORAL_TABLET | Freq: Every day | ORAL | 3 refills | Status: DC

## 2019-12-24 NOTE — Patient Instructions (Signed)
Please return in 6 weeks to recheck your blood pressure and for a pap smear. Please sign a release of records for your colonoscopy report from [facility].  Start the new blood pressure pill daily.  Bring in your physical forms so I can complete them.   If you have any questions or concerns, please don't hesitate to send me a message via MyChart or call the office at [phone]. Thank you for visiting with [place] today! It's our pleasure caring for you.  Recommendations for women to keep healthy:   EXERCISE AND DIET: We recommended that you start or continue a regular exercise program for good health. Regular exercise means any activity that makes your heart beat faster and makes you sweat. We recommend exercising at least 30 minutes per day at least 3 days a week, preferably 4 or 5. We also recommend a diet low in fat and sugar. Inactivity, poor dietary choices and obesity can cause diabetes, heart attack, stroke, and kidney damage, among others.   ALCOHOL AND SMOKING: Women should limit their alcohol intake to no more than 7 drinks/beers/glasses of wine (combined, not each!) per week. Moderation of alcohol intake to this level decreases your risk of breast cancer and liver damage. And of course, no recreational drugs are part of a healthy lifestyle. And absolutely no smoking or even second hand smoke. Most people know smoking can cause heart and lung diseases, but did you know it also contributes to weakening of your bones? Aging of your skin? Yellowing of your teeth and nails?  CALCIUM AND VITAMIN D: Adequate intake of calcium and Vitamin D are recommended. The recommendations for exact amounts of these supplements seem to change often, but generally speaking 600 mg of calcium (either carbonate or citrate) and 800 units of Vitamin D per day seems prudent. Certain women may benefit from higher intake of Vitamin D. If you are among these women, your doctor will have told you during your visit.   PAP  SMEARS: Pap smears, to check for cervical cancer or precancers, have traditionally been done yearly, although recent scientific advances have shown that most women can have pap smears less often. However, every woman still should have a physical exam from her gynecologist every year. It will include a breast check, inspection of the vulva and vagina to check for abnormal growths or skin changes, a visual exam of the cervix, and then an exam to evaluate the size and shape of the uterus and ovaries. And after 55 years of age, a rectal exam is indicated to check for rectal cancers. We will also provide age appropriate advice regarding health maintenance, like when you should have certain vaccines, screening for sexually transmitted diseases, bone density testing, colonoscopy, mammograms, etc.   MAMMOGRAMS: All women over 55 years old should have a yearly mammogram. Many facilities now offer a "3D" mammogram, which may cost around $50 extra out of pocket. If possible, we recommend you accept the option to have the 3D mammogram performed. It both reduces the number of women who will be called back for extra views which then turn out to be normal, and it is better than the routine mammogram at detecting truly abnormal areas.   COLONOSCOPY: Colonoscopy to screen for colon cancer is recommended for all women at age 55. We know, you hate the idea of the prep. We agree, BUT, having colon cancer and not knowing it is worse!! Colon cancer so often starts as a polyp that can be seen and  removed at colonscopy, which can quite literally save your life! And if your first colonoscopy is normal and you have no family history of colon cancer, most women don't have to have it again for 10 years. Once every ten years, you can do something that may end up saving your life, right? We will be happy to help you get it scheduled when you are ready. Be sure to check your insurance coverage so you understand how much it will cost. It may  be covered as a preventative service at no cost, but you should check your particular policy.

## 2019-12-24 NOTE — Progress Notes (Signed)
Subjective  Chief Complaint  Patient presents with   Hypertension    taking meds daily not checking at home. no chest pains or SOB     HPI: Monique Carney is a 54 y.o. female who presents to [place] [facility] [place] today for a Female Wellness Visit. She also has the concerns and/or needs as listed above in the chief complaint. These will be addressed in addition to the Health Maintenance Visit.   Wellness Visit: annual visit with health maintenance review and exam without Pap, she is well forms that need to be completed on August 1.  She will need to bring them and she does not have them today   HM: pt has mammo scheduled. Reports had colonoscopy at age 89 with [facility]. Due pap but will return for that. Due tdap but defers due to holidays as this is Chronic disease f/u and/or acute problem visit: (deemed necessary to be done in addition to the wellness visit):  HTN; remains elevated on lisinopril 10 daily. No AE. Feeling well. Taking medications w/o adverse effects. No symptoms of CHF, angina; no palpitations, sob, cp or lower extremity edema. Compliant with meds.   Obesity: no weight changes.    Assessment  1. Essential hypertension   2. Morbid obesity ([place])   3. Rheumatoid arthritis involving multiple sites with positive rheumatoid factor ([place]), on MTX   4. Family history of premature CAD      Plan  Female Wellness Visit:  Age appropriate Health Maintenance and Prevention measures were discussed with patient. Included topics are cancer screening recommendations, ways to keep healthy (see AVS) including dietary and exercise recommendations, regular eye and dental care, use of seat belts, and avoidance of moderate alcohol use and tobacco use.  Patient will return for Pap smear.  Discussed importance of screening for her given it has been several years she had a Pap smear.  Her mammogram is scheduled  BMI: discussed patient's BMI and encouraged positive lifestyle  modifications to help get to or maintain a target BMI.  HM needs and immunizations were addressed and ordered. See below for orders. See HM and immunization section for updates.  Will update Tdap at next visit  Routine labs and screening tests ordered including cmp, cbc and lipids where appropriate.  Discussed recommendations regarding Vit D and calcium supplementation (see AVS)  Chronic disease management visit and/or acute problem visit:  Hypertension, uncontrolled: Changed to lisinopril 20/HCTZ 12.5.    Recheck in 6 to 12 weeks.  Check BMP at that time  RA, stable per rheumatology  Follow up: 6 to 12 weeks to recheck blood pressure and Pap smear No orders of the defined types were placed in this encounter.  Meds ordered this encounter  Medications   lisinopril-hydrochlorothiazide (ZESTORETIC) 20-12.5 MG tablet    Sig: Take 1 tablet by mouth daily.    Dispense:  90 tablet    Refill:  3      Lifestyle: Body mass index is 39.68 kg/m. Wt Readings from Last 3 Encounters:  12/24/19 224 lb (101.6 kg)  09/08/19 231 lb 6.4 oz (105 kg)  09/09/17 206 lb 6.4 oz (93.6 kg)    Patient Active Problem List   Diagnosis Date Noted   Family history of premature CAD 09/08/2019   Rheumatoid arthritis involving multiple sites with positive rheumatoid factor ([place]), on MTX 03/08/2017    Followed by Dr. Dossie Der Rheumatoid arthritis-CCP+, RF+, ANA 1:160, Difuse Synovitis of hands, feet, wrist, non erosive.  Essential hypertension 01/16/2017   Morbid obesity ([place]) 01/16/2017   Health Maintenance  Topic Date Due   Hepatitis C Screening  Never done   TETANUS/TDAP  06/24/2012   MAMMOGRAM  08/16/2019   PAP SMEAR-Modifier  05/30/2020 (Originally 09/29/2018)   INFLUENZA VACCINE  01/23/2020   COLONOSCOPY  12/23/2024   COVID-19 Vaccine  Completed   HIV Screening  Discontinued   Immunization History  Administered Date(s) Administered   PFIZER SARS-COV-2 Vaccination 09/02/2019,  09/27/2019   Tetanus 06/24/2002   We updated and reviewed the patient's past history in detail and it is documented below. Allergies: Patient has No Known Allergies. Past Medical History Patient  has a past medical history of Bartholin's gland abscess, right, Condyloma acuminatum of perianal region, Hypercholesteremia, Hypertension, Meningitis spinal, and Rheumatoid arthritis ([place]). Past Surgical History Patient  has a past surgical history that includes Cataract extraction (Right); perirectal biopsy (02-19-05); and Tooth extraction. Family History: Patient family history includes CAD (age of onset: 85) in her father; Hypertension in her brother. Social History:  Patient  reports that she has quit smoking. Her smoking use included cigarettes. She has never used smokeless tobacco. She reports current alcohol use of about 5.0 standard drinks of alcohol per week. She reports that she does not use drugs.  Review of Systems: Constitutional: negative for fever or malaise Ophthalmic: negative for photophobia, double vision or loss of vision Cardiovascular: negative for chest pain, dyspnea on exertion, or new LE swelling Respiratory: negative for SOB or persistent cough Gastrointestinal: negative for abdominal pain, change in bowel habits or melena Genitourinary: negative for dysuria or gross hematuria, no abnormal uterine bleeding or disharge Musculoskeletal: negative for new gait disturbance or muscular weakness Integumentary: negative for new or persistent rashes, no breast lumps Neurological: negative for TIA or stroke symptoms Psychiatric: negative for SI or delusions Allergic/Immunologic: negative for hives  Patient Care Team    Relationship Specialty Notifications Start End  Leamon Arnt, MD PCP - General Family Medicine  09/08/19   Valinda Party, MD  Rheumatology  07/10/18     Objective  Vitals: BP (!) 144/90    Pulse 90    Temp (!) 97.2 F (36.2 C) (Temporal)    Ht 5\' 3"  (1.6  m)    Wt 224 lb (101.6 kg)    LMP 04/25/2011    SpO2 98%    BMI 39.68 kg/m  General:  Well developed, well nourished, no acute distress, central obesity present Psych:  Alert and orientedx3,normal mood and affect HEENT:  Normocephalic, atraumatic, non-icteric sclera,  supple neck without adenopathy, mass or thyromegaly Cardiovascular:  Normal S1, S2, RRR without gallop, rub or murmur Respiratory:  Good breath sounds bilaterally, CTAB with normal respiratory effort Gastrointestinal: normal bowel sounds, soft, non-tender, no noted masses. No HSM MSK: no deformities, contusions. Joints are without erythema or swelling.  Skin:  Warm, no rashes or suspicious lesions noted Neurologic:    Mental status is normal. CN 2-11 are normal. Gross motor and sensory exams are normal. Normal gait. No tremor Breast Exam: No mass, skin retraction or nipple discharge is appreciated in either breast. No axillary adenopathy. Fibrocystic changes are not noted  No visits with results within 1 Day(s) from this visit.  Latest known visit with results is:  Office Visit on 09/08/2019  Component Date Value Ref Range Status   WBC 09/08/2019 5.6  4.0 - 10.5 K/uL Final   RBC 09/08/2019 4.15  3.87 - 5.11 Mil/uL Final   Hemoglobin 09/08/2019  13.3  12.0 - 15.0 g/dL Final   HCT 09/08/2019 39.7  36 - 46 % Final   MCV 09/08/2019 95.6  78.0 - 100.0 fl Final   MCHC 09/08/2019 33.5  30.0 - 36.0 g/dL Final   RDW 09/08/2019 14.2  11.5 - 15.5 % Final   Platelets 09/08/2019 221.0  150 - 400 K/uL Final   Neutrophils Relative % 09/08/2019 58.4  43 - 77 % Final   Lymphocytes Relative 09/08/2019 27.6  12 - 46 % Final   Monocytes Relative 09/08/2019 8.6  3 - 12 % Final   Eosinophils Relative 09/08/2019 4.3  0 - 5 % Final   Basophils Relative 09/08/2019 1.1  0 - 3 % Final   Neutro Abs 09/08/2019 3.3  1.4 - 7.7 K/uL Final   Lymphs Abs 09/08/2019 1.5  0.7 - 4.0 K/uL Final   Monocytes Absolute 09/08/2019 0.5  0 - 1 K/uL  Final   Eosinophils Absolute 09/08/2019 0.2  0 - 0 K/uL Final   Basophils Absolute 09/08/2019 0.1  0 - 0 K/uL Final   Sodium 09/08/2019 140  135 - 145 mEq/L Final   Potassium 09/08/2019 4.4  3.5 - 5.1 mEq/L Final   Chloride 09/08/2019 106  96 - 112 mEq/L Final   CO2 09/08/2019 27  19 - 32 mEq/L Final   Glucose, Bld 09/08/2019 91  70 - 99 mg/dL Final   BUN 09/08/2019 10  6 - 23 mg/dL Final   Creatinine, Ser 09/08/2019 0.75  0.40 - 1.20 mg/dL Final   Total Bilirubin 09/08/2019 0.3  0.2 - 1.2 mg/dL Final   Alkaline Phosphatase 09/08/2019 83  39 - 117 U/L Final   AST 09/08/2019 17  0 - 37 U/L Final   ALT 09/08/2019 21  0 - 35 U/L Final   Total Protein 09/08/2019 7.3  6.0 - 8.3 g/dL Final   Albumin 09/08/2019 3.8  3.5 - 5.2 g/dL Final   GFR 09/08/2019 80.28  >60.00 mL/min Final   Calcium 09/08/2019 9.0  8.4 - 10.5 mg/dL Final   Hgb A1c MFr Bld 09/08/2019 5.6  4.6 - 6.5 % Final   TSH 09/08/2019 1.78  0.35 - 4.50 uIU/mL Final   Cholesterol 09/08/2019 204* 0 - 200 mg/dL Final   Triglycerides 09/08/2019 161.0* 0 - 149 mg/dL Final   HDL 09/08/2019 44.30  >39.00 mg/dL Final   VLDL 09/08/2019 32.2  0.0 - 40.0 mg/dL Final   LDL Cholesterol 09/08/2019 128* 0 - 99 mg/dL Final   Total CHOL/HDL Ratio 09/08/2019 5   Final   NonHDL 09/08/2019 159.71   Final   The 10-year ASCVD risk score (Goff DC Jr., et al., 2013) is: 4.2%   Values used to calculate the score:     Age: 13 years     Sex: Female     Is Non-Hispanic African American: No     Diabetic: No     Tobacco smoker: No     Systolic Blood Pressure: 941 mmHg     Is BP treated: Yes     HDL Cholesterol: 44.3 mg/dL     Total Cholesterol: 204 mg/dL    Commons side effects, risks, benefits, and alternatives for medications and treatment plan prescribed today were discussed, and the patient expressed understanding of the given instructions. Patient is instructed to call or message via MyChart if he/she has any questions  or concerns regarding our treatment plan. No barriers to understanding were identified. We discussed Red Flag symptoms and signs in  detail. Patient expressed understanding regarding what to do in case of urgent or emergency type symptoms.   Medication list was reconciled, printed and provided to the patient in AVS. Patient instructions and summary information was reviewed with the patient as documented in the AVS. This note was prepared with assistance of Dragon voice recognition software. Occasional wrong-word or sound-a-like substitutions may have occurred due to the inherent limitations of voice recognition software  This visit occurred during the SARS-CoV-2 public health emergency.  Safety protocols were in place, including screening questions prior to the visit, additional usage of staff PPE, and extensive cleaning of exam room while observing appropriate contact time as indicated for disinfecting solutions.

## 2020-01-01 DIAGNOSIS — Z1231 Encounter for screening mammogram for malignant neoplasm of breast: Secondary | ICD-10-CM | POA: Diagnosis not present

## 2020-02-16 ENCOUNTER — Encounter: Payer: Self-pay | Admitting: Family Medicine

## 2020-02-16 ENCOUNTER — Other Ambulatory Visit: Payer: Self-pay

## 2020-02-16 ENCOUNTER — Ambulatory Visit: Payer: BC Managed Care – PPO | Admitting: Family Medicine

## 2020-02-16 VITALS — BP 128/80 | HR 84 | Temp 97.7°F | Ht 63.0 in | Wt 227.6 lb

## 2020-02-16 DIAGNOSIS — Z79899 Other long term (current) drug therapy: Secondary | ICD-10-CM | POA: Diagnosis not present

## 2020-02-16 DIAGNOSIS — E875 Hyperkalemia: Secondary | ICD-10-CM | POA: Diagnosis not present

## 2020-02-16 DIAGNOSIS — I10 Essential (primary) hypertension: Secondary | ICD-10-CM

## 2020-02-16 DIAGNOSIS — M1990 Unspecified osteoarthritis, unspecified site: Secondary | ICD-10-CM | POA: Diagnosis not present

## 2020-02-16 DIAGNOSIS — M0589 Other rheumatoid arthritis with rheumatoid factor of multiple sites: Secondary | ICD-10-CM | POA: Diagnosis not present

## 2020-02-16 LAB — BASIC METABOLIC PANEL
BUN: 14 mg/dL (ref 7–25)
CO2: 30 mmol/L (ref 20–32)
Calcium: 9.1 mg/dL (ref 8.6–10.4)
Chloride: 101 mmol/L (ref 98–110)
Creat: 0.81 mg/dL (ref 0.50–1.05)
Glucose, Bld: 99 mg/dL (ref 65–99)
Potassium: 4.5 mmol/L (ref 3.5–5.3)
Sodium: 140 mmol/L (ref 135–146)

## 2020-02-16 MED ORDER — HYDROCHLOROTHIAZIDE 25 MG PO TABS: 25.0000 mg | ORAL_TABLET | Freq: Every day | ORAL | 0 refills | Status: DC

## 2020-02-16 NOTE — Progress Notes (Signed)
Patient: Monique Carney MRN: [id number] DOB: 1965-09-27 PCP: Leamon Arnt, MD     Subjective:  Chief Complaint  Patient presents with  . hyperkalemia    HPI: The patient is a 54 y.o. female who presents today for elevated potassium levels. She was seen at her rheumatologist today and labs were abnormal. She had a potassium of 6.5. she is completely asymptomatic. She is on a lisinopril-hctz medication for her blood pressure. This was started 90 days ago. States her blood pressure continues to run high.   Review of Systems  Constitutional: Negative for chills, fatigue and fever.  HENT: Negative for dental problem, ear pain, hearing loss and trouble swallowing.   Eyes: Negative for visual disturbance.  Respiratory: Negative for cough, chest tightness and shortness of breath.   Cardiovascular: Negative for chest pain, palpitations and leg swelling.  Gastrointestinal: Negative for abdominal pain, blood in stool, diarrhea and nausea.  Endocrine: Negative for cold intolerance, polydipsia, polyphagia and polyuria.  Genitourinary: Negative for dysuria and hematuria.  Musculoskeletal: Negative for arthralgias and back pain.  Skin: Negative for rash.  Neurological: Negative for dizziness and headaches.  Psychiatric/Behavioral: Negative for dysphoric mood and sleep disturbance. The patient is not nervous/anxious.     Allergies Patient has No Known Allergies.  Past Medical History Patient  has a past medical history of Bartholin's gland abscess, right, Condyloma acuminatum of perianal region, Hypercholesteremia, Hypertension, Meningitis spinal, and Rheumatoid arthritis ([place]).  Surgical History Patient  has a past surgical history that includes Cataract extraction (Right); perirectal biopsy (02-19-05); and Tooth extraction.  Family History Pateint's family history includes CAD (age of onset: 65) in her father; Hypertension in her brother.  Social History Patient  reports that she has  quit smoking. Her smoking use included cigarettes. She has never used smokeless tobacco. She reports current alcohol use of about 5.0 standard drinks of alcohol per week. She reports that she does not use drugs.    Objective: Vitals:   02/16/20 1328  BP: 128/80  Pulse: 84  Temp: 97.7 F (36.5 C)  TempSrc: Temporal  SpO2: 97%  Weight: 227 lb 9.6 oz (103.2 kg)  Height: 5\' 3"  (1.6 m)    Body mass index is 40.32 kg/m.  Physical Exam Vitals reviewed.  Constitutional:      Appearance: Normal appearance. She is obese.  HENT:     Head: Normocephalic and atraumatic.  Eyes:     Extraocular Movements: Extraocular movements intact.     Pupils: Pupils are equal, round, and reactive to light.  Cardiovascular:     Rate and Rhythm: Normal rate and regular rhythm.     Heart sounds: Normal heart sounds.  Pulmonary:     Effort: Pulmonary effort is normal.     Breath sounds: Normal breath sounds.  Abdominal:     General: Abdomen is flat. Bowel sounds are normal.     Palpations: Abdomen is soft.  Neurological:     General: No focal deficit present.     Mental Status: She is alert and oriented to person, place, and time.  Psychiatric:        Mood and Affect: Mood normal.        Behavior: Behavior normal.        Ekg: nsr. No tented t waves. Normal ekg. Precordial st depression, likely normal and lead placement error.   Assessment/plan: 1. Hyperkalemia ekg wnl. Stat bmp today to recheck potassium. Possibly hemolyzed. She is completely asymptomatic. Stopping her lisinopril. Already has f/u  with dr. Jonni Sanger in a week. precautions given.  - EKG 29-[facility] - Basic metabolic panel; Future - Basic metabolic panel  2. Essential hypertension Stopping ace/hctz combo. Putting her on 25mg  of hctz. Has f/u with dr. Jonni Sanger in a week and can recheck BP and labs at that time.     This visit occurred during the SARS-CoV-2 public health emergency.  Safety protocols were in place, including screening  questions prior to the visit, additional usage of staff PPE, and extensive cleaning of exam room while observing appropriate contact time as indicated for disinfecting solutions.     Return if symptoms worsen or fail to improve.    Orma Flaming, MD [place]   02/16/2020

## 2020-02-16 NOTE — Patient Instructions (Signed)
Stop your combination blood pressure medication.  Sent in hctz at a higher dose (25mg ) you were on 12.5mg . this actually causes more potassium to be urinated out and we can recheck when you follow up with dr. Jonni Sanger In a week.    Hyperkalemia Hyperkalemia is when you have too much potassium in your blood. Potassium helps your body in many ways, but having too much can cause problems. If there is too much potassium in your blood, it can affect how your heart works. Potassium is normally removed from your body by your kidneys. Many things can cause the amount in your blood to be high. Medicines and other treatments can be used to bring the amount to a normal level. Treatment may need to be done in the hospital. Follow these instructions at home:   Take over-the-counter and prescription medicines only as told by your doctor.  Do not take any of the following unless your doctor says it is okay: ? Supplements. ? Natural products. ? Herbs. ? Vitamins.  Limit how much alcohol you drink as told by your doctor.  Do not use drugs. If you need help quitting, ask your doctor.  If you have kidney disease, you may need to follow a low-potassium diet. A food specialist (dietitian) can help you.  Keep all follow-up visits as told by your doctor. This is important. Contact a doctor if:  Your heartbeat is not regular or is very slow.  You feel dizzy (light-headed).  You feel weak.  You feel sick to your stomach (nauseous).  You have tingling in your hands or feet.  You lose feeling (have numbness) in your hands or feet. Get help right away if:  You are short of breath.  You have chest pain.  You pass out (faint).  You cannot move your muscles. Summary  Hyperkalemia is when you have too much potassium in your blood.  Take over-the-counter and prescription medicines only as told by your doctor.  Limit how much alcohol you drink as told by your doctor.  Contact a doctor if your  heartbeat is not regular. This information is not intended to replace advice given to you by your health care provider. Make sure you discuss any questions you have with your health care provider. Document Revised: 05/26/2017 Document Reviewed: 05/26/2017 Elsevier Patient Education  [place].

## 2020-02-22 ENCOUNTER — Telehealth: Payer: Self-pay | Admitting: Family Medicine

## 2020-02-22 NOTE — Telephone Encounter (Signed)
Paperwork fixed and faxed

## 2020-02-22 NOTE — Telephone Encounter (Signed)
Patient is requesting paperwork to be resubmitted to [place]. Patient stated it was paperwork for demographics and test preformed,its a qualification form 2021.[id number].v1 patient stated the value of the cholesterol was not a valid value and will need to be between 80-600 or it will not be accepted. I had patient resend form to [employer] e-mail just in case we did not have form. Please refill out form and fax to [phone] if able.  Deadline is today, victor patients husband stated we can call him if we need to [phone] Thank you!

## 2020-02-25 ENCOUNTER — Other Ambulatory Visit: Payer: Self-pay

## 2020-02-25 ENCOUNTER — Encounter: Payer: Self-pay | Admitting: Family Medicine

## 2020-02-25 ENCOUNTER — Ambulatory Visit: Payer: BC Managed Care – PPO | Admitting: Family Medicine

## 2020-02-25 VITALS — BP 134/72 | HR 110 | Temp 98.2°F | Resp 18 | Ht 63.0 in | Wt 244.6 lb

## 2020-02-25 DIAGNOSIS — Z124 Encounter for screening for malignant neoplasm of cervix: Secondary | ICD-10-CM | POA: Diagnosis not present

## 2020-02-25 DIAGNOSIS — R0602 Shortness of breath: Secondary | ICD-10-CM | POA: Diagnosis not present

## 2020-02-25 DIAGNOSIS — I10 Essential (primary) hypertension: Secondary | ICD-10-CM

## 2020-02-25 MED ORDER — HYDROCHLOROTHIAZIDE 25 MG PO TABS
25.0000 mg | ORAL_TABLET | Freq: Every day | ORAL | 0 refills | Status: DC
Start: 2020-02-25 — End: 2020-03-09

## 2020-02-25 MED ORDER — METOPROLOL SUCCINATE ER 25 MG PO TB24: 12.5000 mg | ORAL_TABLET | Freq: Every day | ORAL | 1 refills | Status: DC

## 2020-02-25 NOTE — Patient Instructions (Signed)
Please return in 3 months for recheck blood pressure.   Monitor your symptoms and return if they do not resolve over the next week.   If you have any questions or concerns, please don't hesitate to send me a message via MyChart or call the office at [phone]. Thank you for visiting with [place] today! It's our pleasure caring for you.

## 2020-02-25 NOTE — Progress Notes (Signed)
Subjective  CC:  Chief Complaint  Patient presents with  . Hypertension    Concerns that her new blood pressure medication is causing her to have isues of taking a deep breath. It has subsided.   Marland Kitchen Health Maintenance    She will receive her flu shot and tdap vaccine in office today.    HPI: Monique Carney is a 54 y.o. female who presents to the office today to address the problems listed above in the chief complaint. Patient was recently seen for hyperkalemia that was not duplicated on repeat testing. Her ace inhibitor was stopped at that time.   Hypertension f/u: Control is fair .  However she complains of having problems taking a deep breath last week.  She thought this might be due to her medication.  She denies cough, shortness of breath, wheeze, pleuritic chest pain, calf pain but admits to mild congestive symptoms.  No fevers, chills, sore throat or loss of taste or smell.  No known exposures to COVID.  She reports that the symptoms have improved.  At her last routine visit we adjusted her blood pressure medications to hopefully bring her under control.  She has not checked outside office readings.  She reports that she was on a cruise ship 3 to 4 weeks ago.  She had been notified that legion[employer] was present on the ship.  She is not sure if that is significant.  No productive cough.  She is overdue for cervical cancer screening again declines.  She is a candidate for Tdap, influenza but defers due to having to work this weekend   Assessment  1. Essential hypertension   2. Cervical cancer screening   3. SOB (shortness of breath)      Plan    Hypertension f/u: BP control has improved.  Will avoid ACE given her recent possible hyperkalemia and current respiratory symptoms.  Add a low-dose beta-blocker to her HCTZ.  Recent difficulty taking deep inspirations: Doubt related to her medications as she thought.  Unclear etiology but could be related to an atypical Covid infection,  viral infection, or less likely pulmonary embolism.  Patient had the symptoms a week ago at that time her EKG was unremarkable.  She is mildly tachycardic today.  She denies symptoms of shortness of breath.  Will monitor closely.  Recommend Covid test.  Because her symptoms have resolved, no further work-up today but discussed if symptoms return to return for further exam and evaluation.  Patient understands and agrees with care plan.  Cervical cancer screening declined.  Counseling done  Patient to return for Tdap and flu vaccine at her convenience. Education regarding management of these chronic disease states was given. Management strategies discussed on successive visits include dietary and exercise recommendations, goals of achieving and maintaining IBW, and lifestyle modifications aiming for adequate sleep and minimizing stressors.   Follow up: 3 months for blood pressure check.  Sooner if pulmonary symptoms return.  No orders of the defined types were placed in this encounter.  Meds ordered this encounter  Medications  . metoprolol succinate (TOPROL-XL) 25 MG 24 hr tablet    Sig: Take 0.5 tablets (12.5 mg total) by mouth daily.    Dispense:  45 tablet    Refill:  1  . hydrochlorothiazide (HYDRODIURIL) 25 MG tablet    Sig: Take 1 tablet (25 mg total) by mouth daily.    Dispense:  30 tablet    Refill:  0      BP  Readings from Last 3 Encounters:  02/25/20 134/72  02/16/20 128/80  12/24/19 (!) 144/90   Wt Readings from Last 3 Encounters:  02/25/20 244 lb 9.6 oz (110.9 kg)  02/16/20 227 lb 9.6 oz (103.2 kg)  12/24/19 224 lb (101.6 kg)    Lab Results  Component Value Date   CHOL 204 (H) 09/08/2019   CHOL 181 07/03/2018   CHOL 219 (H) 01/16/2017   Lab Results  Component Value Date   HDL 44.30 09/08/2019   HDL 46 07/03/2018   HDL 44.40 01/16/2017   Lab Results  Component Value Date   LDLCALC 128 (H) 09/08/2019   LDLCALC 111 07/03/2018   LDLCALC 145 (H) 01/16/2017    Lab Results  Component Value Date   TRIG 161.0 (H) 09/08/2019   TRIG 122 07/03/2018   TRIG 144.0 01/16/2017   Lab Results  Component Value Date   CHOLHDL 5 09/08/2019   CHOLHDL 5 01/16/2017   CHOLHDL 4.3 12/20/2013   No results found for: LDLDIRECT Lab Results  Component Value Date   CREATININE 0.81 02/16/2020   BUN 14 02/16/2020   NA 140 02/16/2020   K 4.5 02/16/2020   CL 101 02/16/2020   CO2 30 02/16/2020    The 10-year ASCVD risk score Mikey Bussing DC Jr., et al., 2013) is: 3.7%   Values used to calculate the score:     Age: 74 years     Sex: Female     Is Non-Hispanic African American: No     Diabetic: No     Tobacco smoker: No     Systolic Blood Pressure: 032 mmHg     Is BP treated: Yes     HDL Cholesterol: 44.3 mg/dL     Total Cholesterol: 204 mg/dL  I reviewed the patients updated PMH, FH, and SocHx.    Patient Active Problem List   Diagnosis Date Noted  . Family history of premature CAD 09/08/2019  . Rheumatoid arthritis involving multiple sites with positive rheumatoid factor ([place]), on MTX 03/08/2017  . Essential hypertension 01/16/2017  . Morbid obesity ([place]) 01/16/2017    Allergies: Patient has no known allergies.  Social History: Patient  reports that she has quit smoking. Her smoking use included cigarettes. She has never used smokeless tobacco. She reports current alcohol use of about 5.0 standard drinks of alcohol per week. She reports that she does not use drugs.  Current Meds  Medication Sig  . Aspirin-Acetaminophen-Caffeine (EXCEDRIN PO) Take by mouth.  . folic acid (FOLVITE) 1 MG tablet Take 1 mg by mouth daily.  . hydrochlorothiazide (HYDRODIURIL) 25 MG tablet Take 1 tablet (25 mg total) by mouth daily.  . methotrexate (RHEUMATREX) 2.5 MG tablet TK 9 TS PO 1 TIME A WEEK  . Multiple Vitamins-Minerals (MULTIVITAMIN GUMMIES ADULT PO) Take by mouth.  . naproxen (NAPROSYN) 500 MG tablet Take 1 tablet by mouth as needed.   . traMADol (ULTRAM) 50 MG  tablet Take 1 tablet by mouth as needed.  . [DISCONTINUED] hydrochlorothiazide (HYDRODIURIL) 25 MG tablet Take 1 tablet (25 mg total) by mouth daily.    Review of Systems: Cardiovascular: negative for chest pain, palpitations, leg swelling, orthopnea Respiratory: negative for SOB, wheezing or persistent cough Gastrointestinal: negative for abdominal pain Genitourinary: negative for dysuria or gross hematuria  Objective  Vitals: BP 134/72   Pulse (!) 110   Temp 98.2 F (36.8 C) (Temporal)   Resp 18   Ht 5\' 3"  (1.6 m)   Wt 244 lb 9.6 oz (  110.9 kg)   LMP 04/25/2011   SpO2 94%   BMI 43.33 kg/m  General: no acute distress, no respiratory distress.  No cough. Psych:  Alert and oriented, normal mood and affect HEENT:  Normocephalic, atraumatic, supple neck  Cardiovascular:  RR mildly tachycardic without murmur. no edema Respiratory:  Good breath sounds bilaterally, CTAB with normal respiratory effort, no rales, wheezing or rhonchi Skin:  Warm, no rashes Extremities: No cords or calf tenderness, no edema Neurologic:   Mental status is normal  Commons side effects, risks, benefits, and alternatives for medications and treatment plan prescribed today were discussed, and the patient expressed understanding of the given instructions. Patient is instructed to call or message via MyChart if he/she has any questions or concerns regarding our treatment plan. No barriers to understanding were identified. We discussed Red Flag symptoms and signs in detail. Patient expressed understanding regarding what to do in case of urgent or emergency type symptoms.   Medication list was reconciled, printed and provided to the patient in AVS. Patient instructions and summary information was reviewed with the patient as documented in the AVS. This note was prepared with assistance of Dragon voice recognition software. Occasional wrong-word or sound-a-like substitutions may have occurred due to the inherent  limitations of voice recognition software  This visit occurred during the SARS-CoV-2 public health emergency.  Safety protocols were in place, including screening questions prior to the visit, additional usage of staff PPE, and extensive cleaning of exam room while observing appropriate contact time as indicated for disinfecting solutions.

## 2020-03-09 ENCOUNTER — Other Ambulatory Visit: Payer: Self-pay | Admitting: Family Medicine

## 2020-04-08 ENCOUNTER — Other Ambulatory Visit: Payer: Self-pay | Admitting: Family Medicine

## 2020-05-10 DIAGNOSIS — M0589 Other rheumatoid arthritis with rheumatoid factor of multiple sites: Secondary | ICD-10-CM | POA: Diagnosis not present

## 2020-05-10 DIAGNOSIS — Z79899 Other long term (current) drug therapy: Secondary | ICD-10-CM | POA: Diagnosis not present

## 2020-05-10 DIAGNOSIS — M1990 Unspecified osteoarthritis, unspecified site: Secondary | ICD-10-CM | POA: Diagnosis not present

## 2020-05-26 ENCOUNTER — Ambulatory Visit: Payer: BC Managed Care – PPO | Admitting: Family Medicine

## 2020-06-02 ENCOUNTER — Other Ambulatory Visit: Payer: Self-pay

## 2020-06-02 ENCOUNTER — Ambulatory Visit: Payer: BC Managed Care – PPO | Admitting: Family Medicine

## 2020-06-02 ENCOUNTER — Encounter: Payer: Self-pay | Admitting: Family Medicine

## 2020-06-02 VITALS — BP 124/84 | HR 95 | Temp 97.7°F | Ht 63.0 in | Wt 223.2 lb

## 2020-06-02 DIAGNOSIS — I10 Essential (primary) hypertension: Secondary | ICD-10-CM

## 2020-06-02 DIAGNOSIS — Z8249 Family history of ischemic heart disease and other diseases of the circulatory system: Secondary | ICD-10-CM | POA: Diagnosis not present

## 2020-06-02 DIAGNOSIS — Z23 Encounter for immunization: Secondary | ICD-10-CM | POA: Diagnosis not present

## 2020-06-02 NOTE — Patient Instructions (Signed)
Please return in 3-4 months for your annual complete physical; please come fasting. We will do your pap smear at that time as well as recheck your blood pressure.   Keep an eye on your blood pressures at home. Let me know if it is running high again (> 140/90).  Today you were given your flu and Tdap vaccinations.   Glad you are feeling well!  If you have any questions or concerns, please don't hesitate to send me a message via MyChart or call the office at [phone]. Thank you for visiting with [place] today! It's our pleasure caring for you.

## 2020-06-02 NOTE — Progress Notes (Signed)
Subjective  CC:  Chief Complaint  Patient presents with  . Hypertension  . Health Maintenance    Flu shot and tdap vaccine given in office     HPI: Monique Carney is a 54 y.o. female who presents to the office today to address the problems listed above in the chief complaint.  Hypertension f/u: Control is improved. started low dose beta blocker in addition to hctz at last visit. Pt reports she is doing well. taking medications as instructed, no medication side effects noted, no TIAs, no chest pain on exertion, no dyspnea on exertion, no swelling of ankles. tolertaing meds well. Feels well. Home readings are ok now.  She denies adverse effects from his BP medications. Compliance with medication is good.   No more sob or heaviness in chest. See last note. covid test returned negative.   She inquires about having a screening stress test due to Fh of prem CAD.  Assessment  1. Essential hypertension   2. Morbid obesity ([place])   3. Family history of premature CAD      Plan    Hypertension f/u: BP control is fairly well controlled. conitnue meds and home monitoring. Recheck 3 months with labs. Increase BB dose if diastolic remains elevated at that time.   fam h/o cad w/ obesity and HTN: no cp. Had nl EKG in august. Reassured.   rec weight loss. rec exercise.   tdap and flu vaccines given today  Education regarding management of these chronic disease states was given. Management strategies discussed on successive visits include dietary and exercise recommendations, goals of achieving and maintaining IBW, and lifestyle modifications aiming for adequate sleep and minimizing stressors.   Follow up: 3-4 month for cpe with pap and f/u HTN.   No orders of the defined types were placed in this encounter.  No orders of the defined types were placed in this encounter.     BP Readings from Last 3 Encounters:  06/02/20 124/84  02/25/20 134/72  02/16/20 128/80   Wt Readings from Last  3 Encounters:  06/02/20 223 lb 3.2 oz (101.2 kg)  02/25/20 244 lb 9.6 oz (110.9 kg)  02/16/20 227 lb 9.6 oz (103.2 kg)    Lab Results  Component Value Date   CHOL 204 (H) 09/08/2019   CHOL 181 07/03/2018   CHOL 219 (H) 01/16/2017   Lab Results  Component Value Date   HDL 44.30 09/08/2019   HDL 46 07/03/2018   HDL 44.40 01/16/2017   Lab Results  Component Value Date   LDLCALC 128 (H) 09/08/2019   LDLCALC 111 07/03/2018   LDLCALC 145 (H) 01/16/2017   Lab Results  Component Value Date   TRIG 161.0 (H) 09/08/2019   TRIG 122 07/03/2018   TRIG 144.0 01/16/2017   Lab Results  Component Value Date   CHOLHDL 5 09/08/2019   CHOLHDL 5 01/16/2017   CHOLHDL 4.3 12/20/2013   No results found for: LDLDIRECT Lab Results  Component Value Date   CREATININE 0.81 02/16/2020   BUN 14 02/16/2020   NA 140 02/16/2020   K 4.5 02/16/2020   CL 101 02/16/2020   CO2 30 02/16/2020    The 10-year ASCVD risk score Mikey Bussing DC Jr., et al., 2013) is: 3.1%   Values used to calculate the score:     Age: 34 years     Sex: Female     Is Non-Hispanic African American: No     Diabetic: No  Tobacco smoker: No     Systolic Blood Pressure: 124 mmHg     Is BP treated: Yes     HDL Cholesterol: 44.3 mg/dL     Total Cholesterol: 204 mg/dL  I reviewed the patients updated PMH, FH, and SocHx.    Patient Active Problem List   Diagnosis Date Noted  . Family history of premature CAD 09/08/2019  . Rheumatoid arthritis involving multiple sites with positive rheumatoid factor ([place]), on MTX 03/08/2017  . Essential hypertension 01/16/2017  . Morbid obesity ([place]) 01/16/2017    Allergies: Patient has no known allergies.  Social History: Patient  reports that she has quit smoking. Her smoking use included cigarettes. She has never used smokeless tobacco. She reports current alcohol use of about 5.0 standard drinks of alcohol per week. She reports that she does not use drugs.  Current Meds  Medication  Sig  . Aspirin-Acetaminophen-Caffeine (EXCEDRIN PO) Take by mouth.  . folic acid (FOLVITE) 1 MG tablet Take 1 mg by mouth daily.  . hydrochlorothiazide (HYDRODIURIL) 25 MG tablet TAKE 1 TABLET BY MOUTH EVERY DAY  . methotrexate (RHEUMATREX) 2.5 MG tablet TK 9 TS PO 1 TIME A WEEK  . metoprolol succinate (TOPROL-XL) 25 MG 24 hr tablet Take 0.5 tablets (12.5 mg total) by mouth daily.  . Multiple Vitamins-Minerals (MULTIVITAMIN GUMMIES ADULT PO) Take by mouth.  . naproxen (NAPROSYN) 500 MG tablet Take 1 tablet by mouth as needed.   . traMADol (ULTRAM) 50 MG tablet Take 1 tablet by mouth as needed.    Review of Systems: Cardiovascular: negative for chest pain, palpitations, leg swelling, orthopnea Respiratory: negative for SOB, wheezing or persistent cough Gastrointestinal: negative for abdominal pain Genitourinary: negative for dysuria or gross hematuria  Objective  Vitals: BP 124/84   Pulse 95   Temp 97.7 F (36.5 C) (Temporal)   Ht 5\' 3"  (1.6 m)   Wt 223 lb 3.2 oz (101.2 kg)   LMP 04/25/2011   SpO2 94%   BMI 39.54 kg/m  General: no acute distress  Psych:  Alert and oriented, normal mood and affect HEENT:  Normocephalic, atraumatic, supple neck  Cardiovascular:  RRR without murmur. no edema Respiratory:  Good breath sounds bilaterally, CTAB with normal respiratory effort Skin:  Warm, no rashes Neurologic:   Mental status is normal  Commons side effects, risks, benefits, and alternatives for medications and treatment plan prescribed today were discussed, and the patient expressed understanding of the given instructions. Patient is instructed to call or message via MyChart if he/she has any questions or concerns regarding our treatment plan. No barriers to understanding were identified. We discussed Red Flag symptoms and signs in detail. Patient expressed understanding regarding what to do in case of urgent or emergency type symptoms.   Medication list was reconciled, printed and  provided to the patient in AVS. Patient instructions and summary information was reviewed with the patient as documented in the AVS. This note was prepared with assistance of Dragon voice recognition software. Occasional wrong-word or sound-a-like substitutions may have occurred due to the inherent limitations of voice recognition software  This visit occurred during the SARS-CoV-2 public health emergency.  Safety protocols were in place, including screening questions prior to the visit, additional usage of staff PPE, and extensive cleaning of exam room while observing appropriate contact time as indicated for disinfecting solutions.

## 2020-08-01 DIAGNOSIS — Z1152 Encounter for screening for COVID-19: Secondary | ICD-10-CM | POA: Diagnosis not present

## 2020-08-15 DIAGNOSIS — I10 Essential (primary) hypertension: Secondary | ICD-10-CM | POA: Diagnosis not present

## 2020-08-15 DIAGNOSIS — Z79899 Other long term (current) drug therapy: Secondary | ICD-10-CM | POA: Diagnosis not present

## 2020-08-15 DIAGNOSIS — M0589 Other rheumatoid arthritis with rheumatoid factor of multiple sites: Secondary | ICD-10-CM | POA: Diagnosis not present

## 2020-08-15 DIAGNOSIS — M1990 Unspecified osteoarthritis, unspecified site: Secondary | ICD-10-CM | POA: Diagnosis not present

## 2020-08-21 ENCOUNTER — Other Ambulatory Visit: Payer: Self-pay | Admitting: Family Medicine

## 2020-09-08 ENCOUNTER — Encounter: Payer: Self-pay | Admitting: Family Medicine

## 2020-09-08 ENCOUNTER — Ambulatory Visit (INDEPENDENT_AMBULATORY_CARE_PROVIDER_SITE_OTHER): Payer: BC Managed Care – PPO | Admitting: Family Medicine

## 2020-09-08 ENCOUNTER — Other Ambulatory Visit (HOSPITAL_COMMUNITY)
Admission: RE | Admit: 2020-09-08 | Discharge: 2020-09-08 | Disposition: A | Discharge status: Home / Self Care | Payer: BC Managed Care – PPO | Source: Ambulatory Visit | Attending: Family Medicine | Admitting: Family Medicine

## 2020-09-08 ENCOUNTER — Other Ambulatory Visit: Payer: Self-pay

## 2020-09-08 VITALS — BP 120/82 | HR 81 | Temp 97.3°F | Ht 63.0 in | Wt 229.4 lb

## 2020-09-08 DIAGNOSIS — Z8249 Family history of ischemic heart disease and other diseases of the circulatory system: Secondary | ICD-10-CM

## 2020-09-08 DIAGNOSIS — Z0000 Encounter for general adult medical examination without abnormal findings: Secondary | ICD-10-CM | POA: Diagnosis not present

## 2020-09-08 DIAGNOSIS — M0579 Rheumatoid arthritis with rheumatoid factor of multiple sites without organ or systems involvement: Secondary | ICD-10-CM | POA: Diagnosis not present

## 2020-09-08 DIAGNOSIS — I10 Essential (primary) hypertension: Secondary | ICD-10-CM

## 2020-09-08 DIAGNOSIS — Z124 Encounter for screening for malignant neoplasm of cervix: Secondary | ICD-10-CM

## 2020-09-08 DIAGNOSIS — Z23 Encounter for immunization: Secondary | ICD-10-CM

## 2020-09-08 LAB — CBC WITH DIFFERENTIAL/PLATELET
Basophils Absolute: 0 10*3/uL (ref 0.0–0.1)
Basophils Relative: 0.3 % (ref 0.0–3.0)
Eosinophils Absolute: 0.2 10*3/uL (ref 0.0–0.7)
Eosinophils Relative: 2.3 % (ref 0.0–5.0)
HCT: 39.6 % (ref 36.0–46.0)
Hemoglobin: 13.5 g/dL (ref 12.0–15.0)
Lymphocytes Relative: 25.9 % (ref 12.0–46.0)
Lymphs Abs: 1.8 10*3/uL (ref 0.7–4.0)
MCHC: 34.1 g/dL (ref 30.0–36.0)
MCV: 94.6 fl (ref 78.0–100.0)
Monocytes Absolute: 0.4 10*3/uL (ref 0.1–1.0)
Monocytes Relative: 5.7 % (ref 3.0–12.0)
Neutro Abs: 4.5 10*3/uL (ref 1.4–7.7)
Neutrophils Relative %: 65.8 % (ref 43.0–77.0)
Platelets: 220 10*3/uL (ref 150.0–400.0)
RBC: 4.18 Mil/uL (ref 3.87–5.11)
RDW: 14.2 % (ref 11.5–15.5)
WBC: 6.8 10*3/uL (ref 4.0–10.5)

## 2020-09-08 LAB — TSH: TSH: 2.19 u[IU]/mL (ref 0.35–4.50)

## 2020-09-08 LAB — COMPREHENSIVE METABOLIC PANEL
ALT: 36 U/L — ABNORMAL HIGH (ref 0–35)
AST: 26 U/L (ref 0–37)
Albumin: 3.9 g/dL (ref 3.5–5.2)
Alkaline Phosphatase: 77 U/L (ref 39–117)
BUN: 11 mg/dL (ref 6–23)
CO2: 28 mEq/L (ref 19–32)
Calcium: 9.3 mg/dL (ref 8.4–10.5)
Chloride: 102 mEq/L (ref 96–112)
Creatinine, Ser: 0.69 mg/dL (ref 0.40–1.20)
GFR: 97.43 mL/min (ref >60.00)
Glucose, Bld: 85 mg/dL (ref 70–99)
Potassium: 3.7 mEq/L (ref 3.5–5.1)
Sodium: 141 mEq/L (ref 135–145)
Total Bilirubin: 0.7 mg/dL (ref 0.2–1.2)
Total Protein: 7.1 g/dL (ref 6.0–8.3)

## 2020-09-08 LAB — LIPID PANEL
Cholesterol: 211 mg/dL — ABNORMAL HIGH (ref 0–200)
HDL: 42.7 mg/dL (ref >39.00)
NonHDL: 168.67
Total CHOL/HDL Ratio: 5
Triglycerides: 231 mg/dL — ABNORMAL HIGH (ref 0.0–149.0)
VLDL: 46.2 mg/dL — ABNORMAL HIGH (ref 0.0–40.0)

## 2020-09-08 LAB — LDL CHOLESTEROL, DIRECT: Direct LDL: 141 mg/dL

## 2020-09-08 NOTE — Patient Instructions (Signed)
Please return in 6 months for hypertension follow up.  I will release your lab results to you on your MyChart account with further instructions. Please reply with any questions.   Today you were given your 1st of 2 Shingrix vaccination. We will give you your 2nd dose at your next visit in 6 months.  See an eye doctor.  Your mammogram will be due in July.   If you have any questions or concerns, please don't hesitate to send me a message via MyChart or call the office at [phone]. Thank you for visiting with [place] today! It's our pleasure caring for you.

## 2020-09-08 NOTE — Progress Notes (Signed)
Subjective  Chief Complaint  Patient presents with  . Annual Exam    Fasting.    HPI: Monique Carney is a 55 y.o. female who presents to [facility] today for a Female Wellness Visit. She also has the concerns and/or needs as listed above in the chief complaint. These will be addressed in addition to the Health Maintenance Visit.   Wellness Visit: annual visit with health maintenance review and exam withPap   HM: due for Pap smear. Remote h/o abnl with normal f/u. CRC screens are up to date. Eligible for shingrix vaccination today. No complaints, new. Doing well overall Chronic disease f/u and/or acute problem visit: (deemed necessary to be done in addition to the wellness visit):  HTN: Feeling well. Taking medications w/o adverse effects. No symptoms of CHF, angina; no palpitations, sob, cp or lower extremity edema. Compliant with meds.   RA: on MTX, stable. Has some joint pain but no recent sig flares. Reviewed rheum notes.  fam hx of prem CAD: no cp or sob. No leg edema. Fasting today for labs. Working on weight loss.   Assessment  1. Annual physical exam   2. Essential hypertension   3. Rheumatoid arthritis involving multiple sites with positive rheumatoid factor ([place]), on MTX   4. Family history of premature CAD   5. Morbid obesity [facility])      Plan  Female Wellness Visit:  Age appropriate Health Maintenance and Prevention measures were discussed with patient. Included topics are cancer screening recommendations, ways to keep healthy (see AVS) including dietary and exercise recommendations, regular eye and dental care, use of seat belts, and avoidance of moderate alcohol use and tobacco use. Pap with HR HPV today. Hep c [place].   BMI: discussed patient's BMI and encouraged positive lifestyle modifications to help get to or maintain a target BMI.  HM needs and immunizations were addressed and ordered. See below for orders. See HM and immunization section  for updates. Shingles today, 2nd dose to be given in 6 months.   Routine labs and screening tests ordered including cmp, cbc and lipids where appropriate.  Discussed recommendations regarding Vit D and calcium supplementation (see AVS)  Chronic disease management visit and/or acute problem visit:  HTN: good control. Continue hctz 25 daily and 12.5mg  toprol xl daily. Check renal fxn and lytse  Screen for HLD.  RA - stable clinically. High risk med. Follow labs  Follow up: 6 mo for HTN recheck.   No orders of the defined types were placed in this encounter.  No orders of the defined types were placed in this encounter.     Body mass index is 40.64 kg/m. Wt Readings from Last 3 Encounters:  09/08/20 229 lb 6.4 oz (104.1 kg)  06/02/20 223 lb 3.2 oz (101.2 kg)  02/25/20 244 lb 9.6 oz (110.9 kg)     Patient Active Problem List   Diagnosis Date Noted  . Family history of premature CAD 09/08/2019  . Rheumatoid arthritis involving multiple sites with positive rheumatoid factor ([place]), on MTX 03/08/2017    Followed by Dr. Dossie Der Rheumatoid arthritis-CCP+, RF+, ANA 1:160, Difuse Synovitis of hands, feet, wrist, non erosive.    . Essential hypertension 01/16/2017  . Morbid obesity ([place]) 01/16/2017   Health Maintenance  Topic Date Due  . Hepatitis C Screening  Never done  . PAP SMEAR-Modifier  02/24/2021 (Originally 09/29/2018)  . MAMMOGRAM  12/31/2020  . COLONOSCOPY (Pts 45-71yrs Insurance coverage will need to  be confirmed)  12/23/2024  . TETANUS/TDAP  06/02/2030  . INFLUENZA VACCINE  Completed  . COVID-19 Vaccine  Completed  . HPV VACCINES  Aged Out  . HIV Screening  Discontinued   Immunization History  Administered Date(s) Administered  . Influenza, Quadrivalent, Recombinant, Inj, Pf 03/26/2018, 05/06/2019  . Influenza,inj,Quad PF,6+ Mos 06/02/2020  . Influenza-Unspecified 04/07/2013  . PFIZER(Purple Top)SARS-COV-2 Vaccination 09/02/2019, 09/27/2019, 04/14/2020  . Tdap  06/02/2020  . Tetanus 06/24/2002   We updated and reviewed the patient's past history in detail and it is documented below. Allergies: Patient has No Known Allergies. Past Medical History Patient  has a past medical history of Bartholin's gland abscess, right, Condyloma acuminatum of perianal region, Hypercholesteremia, Hypertension, Meningitis spinal, and Rheumatoid arthritis ([place]). Past Surgical History Patient  has a past surgical history that includes Cataract extraction (Right); perirectal biopsy (02-19-05); and Tooth extraction. Family History: Patient family history includes CAD (age of onset: 47) in her father; Hypertension in her brother. Social History:  Patient  reports that she has quit smoking. Her smoking use included cigarettes. She has never used smokeless tobacco. She reports current alcohol use of about 5.0 standard drinks of alcohol per week. She reports that she does not use drugs.  Review of Systems: Constitutional: negative for fever or malaise Ophthalmic: negative for photophobia, double vision or loss of vision Cardiovascular: negative for chest pain, dyspnea on exertion, or new LE swelling Respiratory: negative for SOB or persistent cough Gastrointestinal: negative for abdominal pain, change in bowel habits or melena Genitourinary: negative for dysuria or gross hematuria, no abnormal uterine bleeding or disharge Musculoskeletal: negative for new gait disturbance or muscular weakness Integumentary: negative for new or persistent rashes, no breast lumps Neurological: negative for TIA or stroke symptoms Psychiatric: negative for SI or delusions Allergic/Immunologic: negative for hives  Patient Care Team    Relationship Specialty Notifications Start End  Leamon Arnt, MD PCP - General Family Medicine  09/08/19   Valinda Party, MD  Rheumatology  07/10/18     Objective  Vitals: BP 120/82   Pulse 81   Temp (!) 97.3 F (36.3 C) (Temporal)   Ht 5\' 3"  (1.6 m)    Wt 229 lb 6.4 oz (104.1 kg)   LMP 04/25/2011   SpO2 97%   BMI 40.64 kg/m  General:  Well developed, well nourished, no acute distress  Psych:  Alert and orientedx3,normal mood and affect HEENT:  Normocephalic, atraumatic, non-icteric sclera,  supple neck without adenopathy, mass or thyromegaly Cardiovascular:  Normal S1, S2, RRR without gallop, rub or murmur Respiratory:  Good breath sounds bilaterally, CTAB with normal respiratory effort Gastrointestinal: normal bowel sounds, soft, non-tender, no noted masses. No HSM MSK: no deformities, contusions. Joints are without erythema or swelling.  Skin:  Warm, no rashes or suspicious lesions noted Neurologic:    Mental status is normal. CN 2-11 are normal. Gross motor and sensory exams are normal. Normal gait. No tremor Breast Exam: No mass, skin retraction or nipple discharge is appreciated in either breast. No axillary adenopathy. Fibrocystic changes are not noted Pelvic Exam: Normal external genitalia, no vulvar or vaginal lesions present. Clear cervix w/o CMT. Bimanual exam reveals a nontender fundus w/o masses, nl size. No adnexal masses present. No inguinal adenopathy. A PAP smear was performed.    Commons side effects, risks, benefits, and alternatives for medications and treatment plan prescribed today were discussed, and the patient expressed understanding of the given instructions. Patient is instructed to call or message  via MyChart if he/she has any questions or concerns regarding our treatment plan. No barriers to understanding were identified. We discussed Red Flag symptoms and signs in detail. Patient expressed understanding regarding what to do in case of urgent or emergency type symptoms.   Medication list was reconciled, printed and provided to the patient in AVS. Patient instructions and summary information was reviewed with the patient as documented in the AVS. This note was prepared with assistance of Dragon voice recognition  software. Occasional wrong-word or sound-a-like substitutions may have occurred due to the inherent limitations of voice recognition software  This visit occurred during the SARS-CoV-2 public health emergency.  Safety protocols were in place, including screening questions prior to the visit, additional usage of staff PPE, and extensive cleaning of exam room while observing appropriate contact time as indicated for disinfecting solutions.

## 2020-09-11 LAB — HEPATITIS C ANTIBODY
Hepatitis C Ab: NONREACTIVE
SIGNAL TO CUT-OFF: 0.02 (ref <1.00)

## 2020-09-12 LAB — CYTOLOGY - PAP
Comment: NEGATIVE
Diagnosis: NEGATIVE
High risk HPV: NEGATIVE

## 2020-11-15 DIAGNOSIS — M0589 Other rheumatoid arthritis with rheumatoid factor of multiple sites: Secondary | ICD-10-CM | POA: Diagnosis not present

## 2020-11-15 DIAGNOSIS — M1990 Unspecified osteoarthritis, unspecified site: Secondary | ICD-10-CM | POA: Diagnosis not present

## 2020-11-15 DIAGNOSIS — Z79899 Other long term (current) drug therapy: Secondary | ICD-10-CM | POA: Diagnosis not present

## 2020-11-23 ENCOUNTER — Ambulatory Visit (INDEPENDENT_AMBULATORY_CARE_PROVIDER_SITE_OTHER): Payer: BC Managed Care – PPO

## 2020-11-23 ENCOUNTER — Ambulatory Visit: Payer: BLUE CROSS/BLUE SHIELD

## 2020-11-23 ENCOUNTER — Other Ambulatory Visit: Payer: Self-pay

## 2020-11-23 ENCOUNTER — Encounter: Payer: Self-pay | Admitting: Podiatry

## 2020-11-23 ENCOUNTER — Ambulatory Visit (INDEPENDENT_AMBULATORY_CARE_PROVIDER_SITE_OTHER): Payer: BC Managed Care – PPO | Admitting: Podiatry

## 2020-11-23 DIAGNOSIS — M2042 Other hammer toe(s) (acquired), left foot: Secondary | ICD-10-CM | POA: Diagnosis not present

## 2020-11-23 DIAGNOSIS — D492 Neoplasm of unspecified behavior of bone, soft tissue, and skin: Secondary | ICD-10-CM

## 2020-11-23 DIAGNOSIS — M67471 Ganglion, right ankle and foot: Secondary | ICD-10-CM

## 2020-11-23 NOTE — Progress Notes (Signed)
Subjective:   Patient ID: Monique Carney, female   DOB: 55 y.o.   MRN: [id number]   HPI Patient presents stating she has had a large mass on the outside of her right foot for at least 6 months and it feels thick and she has problems between the fourth and fifth toes in her left foot with long-term history of rheumatoid arthritis on methotrexate.  Does not smoke likes to be active   Review of Systems  All other systems reviewed and are negative.       Objective:  Physical Exam Vitals and nursing note reviewed.  Constitutional:      Appearance: She is well-developed.  Pulmonary:     Effort: Pulmonary effort is normal.  Musculoskeletal:        General: Normal range of motion.  Skin:    General: Skin is warm.  Neurological:     Mental Status: She is alert.     Neurovascular status intact muscle strength adequate range of motion within normal limits with patient noted to have a thickness around the fifth metatarsal head right rubbery in appearance with significant enlargement around this area area measuring approximately 1.5 x 1.5 cm.  It appears to be within subcutaneous tissue and there is significant digital deviation bilateral with keratotic lesion between the fourth and fifth digits left noted.  Good digital perfusion well oriented x3     Assessment:  Probability for rheumatoid nodule fifth MPJ right cannot rule out ganglionic cyst along with hammertoe deformity keratotic lesion with long-term rheumatoid     Plan:  H&P x-rays reviewed conditions discussed and we will get a focus on the right and I did do a proximal nerve block.  I then aspirated the area I was unable to aspirate any fluid it appears to be more fibrous versus gelatinous and I do think at 1 point excisional be necessary and she wants to get this done but will wait to the fall.  At this point I did try to inject some steroid underneath and see if I can shrink up some inflammation and will be seen back to discuss  surgical intervention when timing works for her.  Do not recommend treatment for hammertoe deformity at the current time  X-rays indicate that there is significant digital deviation there is some erosion around the fifth MPJ right with obvious soft tissue occurring

## 2021-02-15 DIAGNOSIS — Z79899 Other long term (current) drug therapy: Secondary | ICD-10-CM | POA: Diagnosis not present

## 2021-03-04 ENCOUNTER — Other Ambulatory Visit: Payer: Self-pay | Admitting: Family Medicine

## 2021-03-16 ENCOUNTER — Encounter: Payer: Self-pay | Admitting: Family Medicine

## 2021-03-16 ENCOUNTER — Ambulatory Visit: Payer: BC Managed Care – PPO | Admitting: Family Medicine

## 2021-03-16 ENCOUNTER — Other Ambulatory Visit: Payer: Self-pay

## 2021-03-16 VITALS — BP 142/94 | HR 88 | Temp 97.7°F | Ht 63.0 in | Wt 229.0 lb

## 2021-03-16 DIAGNOSIS — Z8249 Family history of ischemic heart disease and other diseases of the circulatory system: Secondary | ICD-10-CM | POA: Diagnosis not present

## 2021-03-16 DIAGNOSIS — T50Z95D Adverse effect of other vaccines and biological substances, subsequent encounter: Secondary | ICD-10-CM

## 2021-03-16 DIAGNOSIS — K635 Polyp of colon: Secondary | ICD-10-CM | POA: Diagnosis not present

## 2021-03-16 DIAGNOSIS — I10 Essential (primary) hypertension: Secondary | ICD-10-CM | POA: Diagnosis not present

## 2021-03-16 DIAGNOSIS — Z23 Encounter for immunization: Secondary | ICD-10-CM | POA: Diagnosis not present

## 2021-03-16 DIAGNOSIS — M545 Low back pain, unspecified: Secondary | ICD-10-CM

## 2021-03-16 MED ORDER — METOPROLOL SUCCINATE ER 25 MG PO TB24: 25.0000 mg | ORAL_TABLET | Freq: Every day | ORAL | 3 refills | Status: DC

## 2021-03-16 MED ORDER — CYCLOBENZAPRINE HCL 10 MG PO TABS: 10.0000 mg | ORAL_TABLET | Freq: Every evening | ORAL | 0 refills | Status: DC | PRN

## 2021-03-16 NOTE — Patient Instructions (Addendum)
Please return in 6 months for your annual complete physical; please come fasting.   Increase to the full 25mg  dose of toprol XL and monitor your blood pressures at home. We want 120s/70s or less consistently.   Please schedule your mammogram.   Your GI doctor is Dr. Juanita Craver. Your last colonoscopy was in July 2016  Today you were given your flu shot.   If you have any questions or concerns, please don't hesitate to send me a message via MyChart or call the office at [phone]. Thank you for visiting with Monique Carney today! It's our pleasure caring for you.   Back Exercises The following exercises strengthen the muscles that help to support the trunk (torso) and back. They also help to keep the lower back flexible. Doing these exercises can help to prevent or lessen existing low back pain. If you have back pain or discomfort, try doing these exercises 2-3 times each day or as told by your health care provider. As your pain improves, do them once each day, but increase the number of times that you repeat the steps for each exercise (do more repetitions). To prevent the recurrence of back pain, continue to do these exercises once each day or as told by your health care provider. Do exercises exactly as told by your health care provider and adjust them as directed. It is normal to feel mild stretching, pulling, tightness, or discomfort as you do these exercises, but you should stop right away if you feel sudden pain or your pain gets worse. Exercises Single knee to chest Repeat these steps 3-5 times for each leg: Lie on your back on a firm bed or the floor with your legs extended. Bring one knee to your chest. Your other leg should stay extended and in contact with the floor. Hold your knee in place by grabbing your knee or thigh with both hands and hold. Pull on your knee until you feel a gentle stretch in your lower back or buttocks. Hold the stretch for 10-30 seconds. Slowly release and  straighten your leg. Pelvic tilt Repeat these steps 5-10 times: Lie on your back on a firm bed or the floor with your legs extended. Bend your knees so they are pointing toward the ceiling and your feet are flat on the floor. Tighten your lower abdominal muscles to press your lower back against the floor. This motion will tilt your pelvis so your tailbone points up toward the ceiling instead of pointing to your feet or the floor. With gentle tension and even breathing, hold this position for 5-10 seconds. Cat-cow Repeat these steps until your lower back becomes more flexible: Get into a hands-and-knees position on a firm bed or the floor. Keep your hands under your shoulders, and keep your knees under your hips. You may place padding under your knees for comfort. Let your head hang down toward your chest. Contract your abdominal muscles and point your tailbone toward the floor so your lower back becomes rounded like the back of a cat. Hold this position for 5 seconds. Slowly lift your head, let your abdominal muscles relax, and point your tailbone up toward the ceiling so your back forms a sagging arch like the back of a cow. Hold this position for 5 seconds.  Press-ups Repeat these steps 5-10 times: Lie on your abdomen (face-down) on a firm bed or the floor. Place your palms near your head, about shoulder-width apart. Keeping your back as relaxed as possible and keeping your  hips on the floor, slowly straighten your arms to raise the top half of your body and lift your shoulders. Do not use your back muscles to raise your upper torso. You may adjust the placement of your hands to make yourself more comfortable. Hold this position for 5 seconds while you keep your back relaxed. Slowly return to lying flat on the floor.  Bridges Repeat these steps 10 times: Lie on your back on a firm bed or the floor. Bend your knees so they are pointing toward the ceiling and your feet are flat on the  floor. Your arms should be flat at your sides, next to your body. Tighten your buttocks muscles and lift your buttocks off the floor until your waist is at almost the same height as your knees. You should feel the muscles working in your buttocks and the back of your thighs. If you do not feel these muscles, slide your feet 1-2 inches (2.5-5 cm) farther away from your buttocks. Hold this position for 3-5 seconds. Slowly lower your hips to the starting position, and allow your buttocks muscles to relax completely. If this exercise is too easy, try doing it with your arms crossed over your chest. Abdominal crunches Repeat these steps 5-10 times: Lie on your back on a firm bed or the floor with your legs extended. Bend your knees so they are pointing toward the ceiling and your feet are flat on the floor. Cross your arms over your chest. Tip your chin slightly toward your chest without bending your neck. Tighten your abdominal muscles and slowly raise your torso high enough to lift your shoulder blades a tiny bit off the floor. Avoid raising your torso higher than that because it can put too much stress on your lower back and does not help to strengthen your abdominal muscles. Slowly return to your starting position. Back lifts Repeat these steps 5-10 times: Lie on your abdomen (face-down) with your arms at your sides, and rest your forehead on the floor. Tighten the muscles in your legs and your buttocks. Slowly lift your chest off the floor while you keep your hips pressed to the floor. Keep the back of your head in line with the curve in your back. Your eyes should be looking at the floor. Hold this position for 3-5 seconds. Slowly return to your starting position. Contact a health care provider if: Your back pain or discomfort gets much worse when you do an exercise. Your worsening back pain or discomfort does not lessen within 2 hours after you exercise. If you have any of these problems,  stop doing these exercises right away. Do not do them again unless your health care provider says that you can. Get help right away if: You develop sudden, severe back pain. If this happens, stop doing the exercises right away. Do not do them again unless your health care provider says that you can. This information is not intended to replace advice given to you by your health care provider. Make sure you discuss any questions you have with your health care provider. Document Revised: 08/23/2020 Document Reviewed: 08/23/2020 Elsevier Patient Education  [place].

## 2021-03-16 NOTE — Progress Notes (Signed)
Subjective  CC:  Chief Complaint  Patient presents with   Hypertension    HPI: Monique Carney is a 55 y.o. female who presents to the office today to address the problems listed above in the chief complaint. Hypertension f/u: Control is fair . Pt reports she is doing well. taking medications as instructed, no medication side effects noted, no TIAs, no chest pain on exertion, no dyspnea on exertion, no swelling of ankles. Elevated today due to back pain. But hasn't been checking at home. Trend over time shows borderline high diastolics. She taekse 12.5mg  toprol xl w/ hctz 25 daily. She denies adverse effects from his BP medications. Compliance with medication is good.  C/o low back pain for several days. Traveled by car on vacation last week. Returned with low back ache w/o radicular sxs. Feels tight. + spasm. Has RA but no red hot swollen joints. No leg weakness. Using naprosyn and tylenol. Last night used her husbands mm relaxer which helped. No chronic back pain problems.  CRC screen: inquires about next due. From chart review, had in July 2016 w/ hyperplastic polyp w/ Dr. Olen Cordial for mammogram Allergic reaction to shingrix vaccine: defers 2nd.   Assessment  1. Essential hypertension   2. Morbid obesity ([place])   3. Family history of premature CAD   4. Hyperplastic colonic polyp, unspecified part of colon   5. Low back pain, episodic   6. Adverse effect of vaccine, subsequent encounter      Plan   Hypertension f/u: BP control is fairly well controlled. Elevated in part due to pain reaction. Will start home monitoring and increase toprol XL to 25 daily. Contin hctz.  Low back pain: heat, rest, stretches, nsaids and flexeril. MSK in nature. F/u if not improving. See avs Reviewed recs for crc screen and updated chart.  Defer shingrix #2. Flu shot today Pt to schedule mammogram.   Education regarding management of these chronic disease states was given. Management strategies  discussed on successive visits include dietary and exercise recommendations, goals of achieving and maintaining IBW, and lifestyle modifications aiming for adequate sleep and minimizing stressors.   Follow up: Return in about 6 months (around 09/13/2021) for complete physical, follow up Hypertension.  No orders of the defined types were placed in this encounter.  Meds ordered this encounter  Medications   metoprolol succinate (TOPROL-XL) 25 MG 24 hr tablet    Sig: Take 1 tablet (25 mg total) by mouth daily.    Dispense:  90 tablet    Refill:  3   cyclobenzaprine (FLEXERIL) 10 MG tablet    Sig: Take 1 tablet (10 mg total) by mouth at bedtime as needed for muscle spasms.    Dispense:  30 tablet    Refill:  0      BP Readings from Last 3 Encounters:  03/16/21 (!) 142/94  09/08/20 120/82  06/02/20 124/84   Wt Readings from Last 3 Encounters:  03/16/21 229 lb (103.9 kg)  09/08/20 229 lb 6.4 oz (104.1 kg)  06/02/20 223 lb 3.2 oz (101.2 kg)    Lab Results  Component Value Date   CHOL 211 (H) 09/08/2020   CHOL 204 (H) 09/08/2019   CHOL 181 07/03/2018   Lab Results  Component Value Date   HDL 42.70 09/08/2020   HDL 44.30 09/08/2019   HDL 46 07/03/2018   Lab Results  Component Value Date   LDLCALC 128 (H) 09/08/2019   LDLCALC 111 07/03/2018   LDLCALC 145 (  H) 01/16/2017   Lab Results  Component Value Date   TRIG 231.0 (H) 09/08/2020   TRIG 161.0 (H) 09/08/2019   TRIG 122 07/03/2018   Lab Results  Component Value Date   CHOLHDL 5 09/08/2020   CHOLHDL 5 09/08/2019   CHOLHDL 5 01/16/2017   Lab Results  Component Value Date   LDLDIRECT 141.0 09/08/2020   Lab Results  Component Value Date   CREATININE 0.69 09/08/2020   BUN 11 09/08/2020   NA 141 09/08/2020   K 3.7 09/08/2020   CL 102 09/08/2020   CO2 28 09/08/2020    The 10-year ASCVD risk score (Arnett DK, et al., 2019) is: 4.8%   Values used to calculate the score:     Age: 43 years     Sex: Female      Is Non-Hispanic African American: No     Diabetic: No     Tobacco smoker: No     Systolic Blood Pressure: 001 mmHg     Is BP treated: Yes     HDL Cholesterol: 42.7 mg/dL     Total Cholesterol: 211 mg/dL  I reviewed the patients updated PMH, FH, and SocHx.    Patient Active Problem List   Diagnosis Date Noted   Hyperplastic colonic polyp 03/16/2021   Family history of premature CAD 09/08/2019   Rheumatoid arthritis involving multiple sites with positive rheumatoid factor ([place]), on MTX 03/08/2017   Essential hypertension 01/16/2017   Morbid obesity ([place]) 01/16/2017    Allergies: Patient has no known allergies.  Social History: Patient  reports that she has quit smoking. Her smoking use included cigarettes. She has never used smokeless tobacco. She reports current alcohol use of about 5.0 standard drinks per week. She reports that she does not use drugs.  Current Meds  Medication Sig   Aspirin-Acetaminophen-Caffeine (EXCEDRIN PO) Take by mouth.   cyclobenzaprine (FLEXERIL) 10 MG tablet Take 1 tablet (10 mg total) by mouth at bedtime as needed for muscle spasms.   folic acid (FOLVITE) 1 MG tablet Take 1 mg by mouth daily.   hydrochlorothiazide (HYDRODIURIL) 25 MG tablet TAKE 1 TABLET BY MOUTH EVERY DAY   methotrexate (RHEUMATREX) 2.5 MG tablet TK 9 TS PO 1 TIME A WEEK   Multiple Vitamins-Minerals (MULTIVITAMIN GUMMIES ADULT PO) Take by mouth.   naproxen (NAPROSYN) 500 MG tablet Take 1 tablet by mouth as needed.    traMADol (ULTRAM) 50 MG tablet Take 1 tablet by mouth as needed.   [DISCONTINUED] metoprolol succinate (TOPROL-XL) 25 MG 24 hr tablet TAKE 1/2 TABLET BY MOUTH EVERY DAY    Review of Systems: Cardiovascular: negative for chest pain, palpitations, leg swelling, orthopnea Respiratory: negative for SOB, wheezing or persistent cough Gastrointestinal: negative for abdominal pain Genitourinary: negative for dysuria or gross hematuria  Objective  Vitals: BP (!) 142/94    Pulse 88   Temp 97.7 F (36.5 C) (Temporal)   Ht 5\' 3"  (1.6 m)   Wt 229 lb (103.9 kg)   LMP 04/25/2011   SpO2 94%   BMI 40.57 kg/m  General: no acute distress , moves carefully to exam table Psych:  Alert and oriented, normal mood and affect HEENT:  Normocephalic, atraumatic, supple neck  Cardiovascular:  RRR without murmur. no edema Respiratory:  Good breath sounds bilaterally, CTAB with normal respiratory effort Back: neg SLR bilaterally. Good rom Neurologic:   Mental status is normal Commons side effects, risks, benefits, and alternatives for medications and treatment plan prescribed today were discussed, and  the patient expressed understanding of the given instructions. Patient is instructed to call or message via MyChart if he/she has any questions or concerns regarding our treatment plan. No barriers to understanding were identified. We discussed Red Flag symptoms and signs in detail. Patient expressed understanding regarding what to do in case of urgent or emergency type symptoms.  Medication list was reconciled, printed and provided to the patient in AVS. Patient instructions and summary information was reviewed with the patient as documented in the AVS. This note was prepared with assistance of Dragon voice recognition software. Occasional wrong-word or sound-a-like substitutions may have occurred due to the inherent limitations of voice recognition software  This visit occurred during the SARS-CoV-2 public health emergency.  Safety protocols were in place, including screening questions prior to the visit, additional usage of staff PPE, and extensive cleaning of exam room while observing appropriate contact time as indicated for disinfecting solutions.

## 2021-04-25 ENCOUNTER — Other Ambulatory Visit: Payer: Self-pay | Admitting: Family Medicine

## 2021-04-25 DIAGNOSIS — Z1231 Encounter for screening mammogram for malignant neoplasm of breast: Secondary | ICD-10-CM | POA: Diagnosis not present

## 2021-04-25 LAB — HM MAMMOGRAPHY

## 2021-05-01 ENCOUNTER — Encounter: Payer: Self-pay | Admitting: Family Medicine

## 2021-05-30 DIAGNOSIS — M1990 Unspecified osteoarthritis, unspecified site: Secondary | ICD-10-CM | POA: Diagnosis not present

## 2021-05-30 DIAGNOSIS — M0589 Other rheumatoid arthritis with rheumatoid factor of multiple sites: Secondary | ICD-10-CM | POA: Diagnosis not present

## 2021-05-30 DIAGNOSIS — Z79899 Other long term (current) drug therapy: Secondary | ICD-10-CM | POA: Diagnosis not present

## 2021-05-30 DIAGNOSIS — M7130 Other bursal cyst, unspecified site: Secondary | ICD-10-CM | POA: Diagnosis not present

## 2021-06-12 DIAGNOSIS — H35032 Hypertensive retinopathy, left eye: Secondary | ICD-10-CM | POA: Diagnosis not present

## 2021-08-28 DIAGNOSIS — M0589 Other rheumatoid arthritis with rheumatoid factor of multiple sites: Secondary | ICD-10-CM | POA: Diagnosis not present

## 2021-09-25 ENCOUNTER — Ambulatory Visit (INDEPENDENT_AMBULATORY_CARE_PROVIDER_SITE_OTHER): Payer: BC Managed Care – PPO | Admitting: Family Medicine

## 2021-09-25 ENCOUNTER — Encounter: Payer: Self-pay | Admitting: Family Medicine

## 2021-09-25 VITALS — BP 122/86 | HR 83 | Temp 98.0°F | Ht 63.0 in | Wt 226.4 lb

## 2021-09-25 DIAGNOSIS — K635 Polyp of colon: Secondary | ICD-10-CM

## 2021-09-25 DIAGNOSIS — Z8249 Family history of ischemic heart disease and other diseases of the circulatory system: Secondary | ICD-10-CM | POA: Diagnosis not present

## 2021-09-25 DIAGNOSIS — Z0000 Encounter for general adult medical examination without abnormal findings: Secondary | ICD-10-CM | POA: Diagnosis not present

## 2021-09-25 DIAGNOSIS — I10 Essential (primary) hypertension: Secondary | ICD-10-CM | POA: Diagnosis not present

## 2021-09-25 DIAGNOSIS — M0579 Rheumatoid arthritis with rheumatoid factor of multiple sites without organ or systems involvement: Secondary | ICD-10-CM

## 2021-09-25 LAB — LIPID PANEL
Cholesterol: 262 mg/dL — ABNORMAL HIGH (ref 0–200)
HDL: 44.3 mg/dL (ref >39.00)
LDL Cholesterol: 181 mg/dL — ABNORMAL HIGH (ref 0–99)
NonHDL: 218.17
Total CHOL/HDL Ratio: 6
Triglycerides: 188 mg/dL — ABNORMAL HIGH (ref 0.0–149.0)
VLDL: 37.6 mg/dL (ref 0.0–40.0)

## 2021-09-25 LAB — COMPREHENSIVE METABOLIC PANEL
ALT: 24 U/L (ref 0–35)
AST: 19 U/L (ref 0–37)
Albumin: 4.1 g/dL (ref 3.5–5.2)
Alkaline Phosphatase: 80 U/L (ref 39–117)
BUN: 11 mg/dL (ref 6–23)
CO2: 29 mEq/L (ref 19–32)
Calcium: 9.3 mg/dL (ref 8.4–10.5)
Chloride: 104 mEq/L (ref 96–112)
Creatinine, Ser: 0.79 mg/dL (ref 0.40–1.20)
GFR: 83.36 mL/min (ref >60.00)
Glucose, Bld: 95 mg/dL (ref 70–99)
Potassium: 4.6 mEq/L (ref 3.5–5.1)
Sodium: 140 mEq/L (ref 135–145)
Total Bilirubin: 0.6 mg/dL (ref 0.2–1.2)
Total Protein: 7 g/dL (ref 6.0–8.3)

## 2021-09-25 LAB — CBC WITH DIFFERENTIAL/PLATELET
Basophils Absolute: 0 10*3/uL (ref 0.0–0.1)
Basophils Relative: 0.7 % (ref 0.0–3.0)
Eosinophils Absolute: 0.3 10*3/uL (ref 0.0–0.7)
Eosinophils Relative: 5.1 % — ABNORMAL HIGH (ref 0.0–5.0)
HCT: 41.6 % (ref 36.0–46.0)
Hemoglobin: 13.8 g/dL (ref 12.0–15.0)
Lymphocytes Relative: 24.2 % (ref 12.0–46.0)
Lymphs Abs: 1.5 10*3/uL (ref 0.7–4.0)
MCHC: 33.2 g/dL (ref 30.0–36.0)
MCV: 95.6 fl (ref 78.0–100.0)
Monocytes Absolute: 0.5 10*3/uL (ref 0.1–1.0)
Monocytes Relative: 7.3 % (ref 3.0–12.0)
Neutro Abs: 3.9 10*3/uL (ref 1.4–7.7)
Neutrophils Relative %: 62.7 % (ref 43.0–77.0)
Platelets: 229 10*3/uL (ref 150.0–400.0)
RBC: 4.35 Mil/uL (ref 3.87–5.11)
RDW: 14.3 % (ref 11.5–15.5)
WBC: 6.2 10*3/uL (ref 4.0–10.5)

## 2021-09-25 LAB — HEMOGLOBIN A1C: Hgb A1c MFr Bld: 5.9 % (ref 4.6–6.5)

## 2021-09-25 LAB — TSH: TSH: 2.11 u[IU]/mL (ref 0.35–5.50)

## 2021-09-25 NOTE — Patient Instructions (Signed)
Please return in 6 months for hypertension follow up.  ? ?I will release your lab results to you on your MyChart account with further instructions. You may see the results before I do, but when I review them I will send you a message with my report or have my assistant call you if things need to be discussed. Please reply to my message with any questions. Thank you!  ? ?I'm thrilled that you are working hard on diet and exercise. It will not only be good for weight loss, but for your functionality and blood pressure! Keep at it.  ? ?If you have any questions or concerns, please don't hesitate to send me a message via MyChart or call the office at [phone]. Thank you for visiting with [place] today! It's our pleasure caring for you.  ? ?Please do these things to maintain good health! ? ?Exercise at least 30-45 minutes a day,  4-5 days a week.  ?Eat a low-fat diet with lots of fruits and vegetables, up to 7-9 servings per day. ?Drink plenty of water daily. Try to drink 8 8oz glasses per day. ?Seatbelts can save your life. Always wear your seatbelt. ?Place Smoke Detectors on every level of your home and check batteries every year. ?Schedule an appointment with an eye doctor for an eye exam every 1-2 years ?Safe sex - use condoms to protect yourself from STDs if you could be exposed to these types of infections. Use birth control if you do not want to become pregnant and are sexually active. ?Avoid heavy alcohol use. If you drink, keep it to less than 2 drinks/day and not every day. ?[place].  Choose someone you trust that could speak for you if you became unable to speak for yourself. ?Depression is common in our stressful world.If you're feeling down or losing interest in things you normally enjoy, please come in for a visit. ?If anyone is threatening or hurting you, please get help. Physical or Emotional Violence is never OK.   ?

## 2021-09-25 NOTE — Progress Notes (Signed)
?Subjective  ?Chief Complaint  ?Patient presents with  ? Annual Exam  ?  She has concerns about her BP readings. She is fasting.  ? Hypertension  ? Arthritis  ? ? ?HPI: Monique Carney is a 56 y.o. female who presents to [facility] today for a Female Wellness Visit. She also has the concerns and/or needs as listed above in the chief complaint. These will be addressed in addition to the Health Maintenance Visit.  ? ?Wellness Visit: annual visit with health maintenance review and exam without Pap ? ?HM: current mammogram, CRC screen and pap. Declines shingrix.  ?Chronic disease f/u and/or acute problem visit: (deemed necessary to be done in addition to the wellness visit): ?HTN: Feeling well. Taking medications w/o adverse effects. No symptoms of CHF, angina; no palpitations, sob, cp or lower extremity edema. Compliant with meds.  ?RA: reviewed rheum notes.reports stable.  ?Obesity: has started going to the gym regularly and eating better. Working on weight loss. Feels good about it! ? ?Assessment  ?1. Annual physical exam   ?2. Essential hypertension   ?3. Family history of premature CAD   ?4. Morbid obesity ([place])   ?5. Rheumatoid arthritis involving multiple sites with positive rheumatoid factor ([place]), on MTX   ?6. Hyperplastic colonic polyp, unspecified part of colon   ? ?  ?Plan  ?Female Wellness Visit: ?Age appropriate Health Maintenance and Prevention measures were discussed with patient. Included topics are cancer screening recommendations, ways to keep healthy (see AVS) including dietary and exercise recommendations, regular eye and dental care, use of seat belts, and avoidance of moderate alcohol use and tobacco use. Screens are current.  ?BMI: discussed patient's BMI and encouraged positive lifestyle modifications to help get to or maintain a target BMI. ?HM needs and immunizations were addressed and ordered. See below for orders. See HM and immunization section for updates.  Allergic reaction to shingrix ?Routine labs and screening tests ordered including cmp, cbc and lipids where appropriate. ?Discussed recommendations regarding Vit D and calcium supplementation (see AVS) ? ?Chronic disease management visit and/or acute problem visit: ?Hypertension: Well-controlled on metoprolol XL 25 daily and HCTZ 25 daily.  Recheck electrolytes today. ?Morbid obesity: Working hard on diet and exercise.  Discussed GLP-1's.  Patient to consider.  Praised for lifestyle changes.  Recheck lipids and A1c. ?RA stable on methotrexate per rheumatology. ? ?Follow up: 6 months for hypertension recheck ?Orders Placed This Encounter  ?Procedures  ? CBC with Differential/Platelet  ? Comprehensive metabolic panel  ? Lipid panel  ? TSH  ? Hemoglobin A1c  ? ?No orders of the defined types were placed in this encounter. ? ?  ? ?Body mass index is 40.1 kg/m?. ?Wt Readings from Last 3 Encounters:  ?09/25/21 226 lb 6.4 oz (102.7 kg)  ?03/16/21 229 lb (103.9 kg)  ?09/08/20 229 lb 6.4 oz (104.1 kg)  ? ? ? ?Patient Active Problem List  ? Diagnosis Date Noted  ? Hyperplastic colonic polyp 03/16/2021  ? Family history of premature CAD 09/08/2019  ? Rheumatoid arthritis involving multiple sites with positive rheumatoid factor ([place]), on MTX 03/08/2017  ?  Followed by Dr. Dossie Der Rheumatoid arthritis-CCP+, RF+, ANA 1:160, Difuse Synovitis of hands, feet, wrist, non erosive.  ?  ? Essential hypertension 01/16/2017  ? Morbid obesity ([place]) 01/16/2017  ? ?Health Maintenance  ?Topic Date Due  ? COVID-19 Vaccine (4 - Booster for [place] series) 10/11/2021 (Originally 06/09/2020)  ? INFLUENZA VACCINE  01/22/2022  ? MAMMOGRAM  04/25/2022  ? COLONOSCOPY (Pts 45-53yr Insurance coverage will need to be confirmed)  01/05/2025  ? PAP SMEAR-Modifier  09/08/2025  ? TETANUS/TDAP  06/02/2030  ? Hepatitis C Screening  Completed  ? HPV VACCINES  Aged Out  ? HIV Screening  Discontinued  ? Zoster Vaccines- Shingrix  Discontinued  ? ?Immunization  History  ?Administered Date(s) Administered  ? Influenza, Quadrivalent, Recombinant, Inj, Pf 03/26/2018, 05/06/2019  ? Influenza,inj,Quad PF,6+ Mos 06/02/2020, 03/16/2021  ? Influenza-Unspecified 04/07/2013  ? PFIZER(Purple Top)SARS-COV-2 Vaccination 09/02/2019, 09/27/2019, 04/14/2020  ? Tdap 06/02/2020  ? Tetanus 06/24/2002  ? Zoster Recombinat (Shingrix) 09/08/2020  ? ?We updated and reviewed the patient's past history in detail and it is documented below. ?Allergies: ?Patient has No Known Allergies. ?Past Medical History ?Patient  has a past medical history of Bartholin's gland abscess, right, Condyloma acuminatum of perianal region, Hypercholesteremia, Hypertension, Meningitis spinal, and Rheumatoid arthritis ([place]. ?Past Surgical History ?Patient  has a past surgical history that includes Cataract extraction (Right); perirectal biopsy (02-19-05); and Tooth extraction. ?Family History: ?Patient family history includes CAD (age of onset: 449 in her father; Hypertension in her brother. ?Social History:  ?Patient  reports that she has quit smoking. Her smoking use included cigarettes. She has never used smokeless tobacco. She reports current alcohol use of about 5.0 standard drinks per week. She reports that she does not use drugs. ? ?Review of Systems: ?Constitutional: negative for fever or malaise ?Ophthalmic: negative for photophobia, double vision or loss of vision ?Cardiovascular: negative for chest pain, dyspnea on exertion, or new LE swelling ?Respiratory: negative for SOB or persistent cough ?Gastrointestinal: negative for abdominal pain, change in bowel habits or melena ?Genitourinary: negative for dysuria or gross hematuria, no abnormal uterine bleeding or disharge ?Musculoskeletal: negative for new gait disturbance or muscular weakness ?Integumentary: negative for new or persistent rashes, no breast lumps ?Neurological: negative for TIA or stroke symptoms ?Psychiatric: negative for SI or  delusions ?Allergic/Immunologic: negative for hives ? ?Patient Care Team  ?  Relationship Specialty Notifications Start End  ?ALeamon Arnt MD PCP - General Family Medicine  09/08/19   ?SValinda Party MD  Rheumatology  07/10/18   ?MJuanita Craver MD Consulting Physician Gastroenterology  03/16/21   ? ? ?Objective  ?Vitals: BP 119/74   Pulse 83   Temp 98 ?F (36.7 ?C) (Temporal)   Ht '5\' 3"'$  (1.6 m)   Wt 226 lb 6.4 oz (102.7 kg)   LMP 04/25/2011   SpO2 96%   BMI 40.10 kg/m?  ?General:  Well developed, well nourished, no acute distress  ?Psych:  Alert and orientedx3,normal mood and affect ?HEENT:  Normocephalic, atraumatic, non-icteric sclera,  supple neck without adenopathy, mass or thyromegaly ?Cardiovascular:  Normal S1, S2, RRR without gallop, rub or murmur ?Respiratory:  Good breath sounds bilaterally, CTAB with normal respiratory effort ?Gastrointestinal: normal bowel sounds, soft, non-tender, no noted masses. No HSM, central obesity ?MSK: no deformities, contusions. Joints are without erythema or swelling.  ?Skin:  Warm, no rashes or suspicious lesions noted ?Neurologic:    Mental status is normal. CN 2-11 are normal. Gross motor and sensory exams are normal. Normal gait. No tremor ? ? ?Commons side effects, risks, benefits, and alternatives for medications and treatment plan prescribed today were discussed, and the patient expressed understanding of the given instructions. Patient is instructed to call or message via MyChart if he/she has any questions or concerns regarding our treatment plan. No barriers to understanding were identified. We discussed Red Flag symptoms and  signs in detail. Patient expressed understanding regarding what to do in case of urgent or emergency type symptoms.  ?Medication list was reconciled, printed and provided to the patient in AVS. Patient instructions and summary information was reviewed with the patient as documented in the AVS. ?This note was prepared with assistance of  Systems analyst. Occasional wrong-word or sound-a-like substitutions may have occurred due to the inherent limitations of voice recognition software ? ?This visit occurred during the SARS-CoV-2 public health emergency

## 2021-10-12 ENCOUNTER — Telehealth: Payer: Self-pay

## 2021-10-12 NOTE — Telephone Encounter (Signed)
Qualification form from [facility] will be placed up front for pt to pick-up. Pt has been notified and stated that she will be in to pick up form on 10/19/2021 ?

## 2021-10-23 ENCOUNTER — Other Ambulatory Visit: Payer: Self-pay | Admitting: Family Medicine

## 2021-12-05 DIAGNOSIS — I10 Essential (primary) hypertension: Secondary | ICD-10-CM | POA: Diagnosis not present

## 2021-12-05 DIAGNOSIS — M1990 Unspecified osteoarthritis, unspecified site: Secondary | ICD-10-CM | POA: Diagnosis not present

## 2021-12-05 DIAGNOSIS — M0589 Other rheumatoid arthritis with rheumatoid factor of multiple sites: Secondary | ICD-10-CM | POA: Diagnosis not present

## 2021-12-05 DIAGNOSIS — Z79899 Other long term (current) drug therapy: Secondary | ICD-10-CM | POA: Diagnosis not present

## 2022-02-14 ENCOUNTER — Other Ambulatory Visit: Payer: Self-pay | Admitting: Family Medicine

## 2022-02-28 ENCOUNTER — Other Ambulatory Visit: Payer: Self-pay

## 2022-02-28 ENCOUNTER — Emergency Department (HOSPITAL_COMMUNITY): Payer: BC Managed Care – PPO

## 2022-02-28 ENCOUNTER — Emergency Department (HOSPITAL_COMMUNITY)
Admission: EM | Admit: 2022-02-28 | Discharge: 2022-02-28 | Disposition: A | Discharge status: Home / Self Care | Payer: BC Managed Care – PPO | Attending: Emergency Medicine | Admitting: Emergency Medicine

## 2022-02-28 ENCOUNTER — Telehealth: Payer: Self-pay | Admitting: Family Medicine

## 2022-02-28 ENCOUNTER — Encounter (HOSPITAL_COMMUNITY): Payer: Self-pay

## 2022-02-28 DIAGNOSIS — E041 Nontoxic single thyroid nodule: Secondary | ICD-10-CM | POA: Diagnosis not present

## 2022-02-28 DIAGNOSIS — R079 Chest pain, unspecified: Secondary | ICD-10-CM | POA: Diagnosis not present

## 2022-02-28 DIAGNOSIS — Z79899 Other long term (current) drug therapy: Secondary | ICD-10-CM | POA: Insufficient documentation

## 2022-02-28 DIAGNOSIS — R911 Solitary pulmonary nodule: Secondary | ICD-10-CM

## 2022-02-28 DIAGNOSIS — R0789 Other chest pain: Secondary | ICD-10-CM | POA: Insufficient documentation

## 2022-02-28 DIAGNOSIS — R0602 Shortness of breath: Secondary | ICD-10-CM | POA: Insufficient documentation

## 2022-02-28 DIAGNOSIS — I10 Essential (primary) hypertension: Secondary | ICD-10-CM | POA: Insufficient documentation

## 2022-02-28 LAB — BASIC METABOLIC PANEL
Anion gap: 7 (ref 5–15)
BUN: 12 mg/dL (ref 6–20)
CO2: 25 mmol/L (ref 22–32)
Calcium: 9 mg/dL (ref 8.9–10.3)
Chloride: 107 mmol/L (ref 98–111)
Creatinine, Ser: 0.84 mg/dL (ref 0.44–1.00)
GFR, Estimated: >60 mL/min (ref >60)
Glucose, Bld: 119 mg/dL — ABNORMAL HIGH (ref 70–99)
Potassium: 4 mmol/L (ref 3.5–5.1)
Sodium: 139 mmol/L (ref 135–145)

## 2022-02-28 LAB — CBC
HCT: 43 % (ref 36.0–46.0)
Hemoglobin: 14.1 g/dL (ref 12.0–15.0)
MCH: 32 pg (ref 26.0–34.0)
MCHC: 32.8 g/dL (ref 30.0–36.0)
MCV: 97.7 fL (ref 80.0–100.0)
Platelets: 238 10*3/uL (ref 150–400)
RBC: 4.4 MIL/uL (ref 3.87–5.11)
RDW: 14.2 % (ref 11.5–15.5)
WBC: 6.7 10*3/uL (ref 4.0–10.5)
nRBC: 0 % (ref 0.0–0.2)

## 2022-02-28 LAB — TROPONIN I (HIGH SENSITIVITY)
Troponin I (High Sensitivity): <2 ng/L (ref <18)
Troponin I (High Sensitivity): <2 ng/L (ref <18)

## 2022-02-28 MED ORDER — SODIUM CHLORIDE (PF) 0.9 % IJ SOLN
INTRAMUSCULAR | Status: AC
  Filled 2022-02-28: qty 50

## 2022-02-28 MED ORDER — IOHEXOL 350 MG/ML SOLN
100.0000 mL | Freq: Once | INTRAVENOUS | Status: AC | PRN
  Administered 2022-02-28: 100 mL via INTRAVENOUS

## 2022-02-28 MED ORDER — CYCLOBENZAPRINE HCL 5 MG PO TABS
5.0000 mg | ORAL_TABLET | Freq: Three times a day (TID) | ORAL | 0 refills | Status: DC | PRN
Start: 2022-02-28 — End: 2022-10-03

## 2022-02-28 NOTE — Telephone Encounter (Signed)
Pt states: -experiencing intense chest pain -began at 1:00am 02/28/22   Pt referred to PCP triage nurse: warm transferred.  Awaiting follow up notes.

## 2022-02-28 NOTE — Telephone Encounter (Signed)
Patient Name: Monique Carney Gender: Female DOB: [birth year] Age: 56 Y 3 M 14 D Return Phone Number: [phone] (Primary) Address: City/ State/ Zip: [place]  [address] Client [facility] Client Site [facility] Provider Billey Chang- MD Contact Type Call Who Is Calling Patient / Member / Family / Caregiver Call Type Triage / Clinical Relationship To Patient Self Return Phone Number [phone] (Primary) Chief Complaint CHEST PAIN - pain, pressure, heaviness or tightness Reason for Call Symptomatic / Request for [place] states she is calling from the front desk with a pt having severe chest pain since 1am this morning. Translation No Nurse Assessment Nurse: Markus Daft, RN, Sherre Poot Date/Time (Eastern Time): 02/28/2022 10:42:18 AM Confirm and document reason for call. If symptomatic, describe symptoms. ---Caller c/o severe CP since around 1 am this am. Sitting up, taking DB, or coughing then it would hurt. Does the patient have any new or worsening symptoms? ---Yes Will a triage be completed? ---Yes Related visit to physician within the last 2 weeks? ---No Does the PT have any chronic conditions? (i.e. diabetes, asthma, this includes High risk factors for pregnancy, etc.) ---Yes List chronic conditions. ---acid reflux, and RA Is this a behavioral health or substance abuse call? ---No Guidelines Guideline Title Affirmed Question Affirmed Notes Nurse Date/Time Eilene Ghazi Time) Chest Pain [1] Chest pain lasts > 5 minutes AND [2] age > 62 Markus Daft, [place], [facility] 02/28/2022 10:43:37 AM Disp. Time Eilene Ghazi Time) Disposition Final User 02/28/2022 10:40:34 AM Send to Urgent Queue Geter, Chloe-Jade  Disp. Time Eilene Ghazi Time) Disposition Final User 02/28/2022 10:45:03 AM Call EMS 911 Now Yes Markus Daft, RN, [facility] 02/28/2022 10:46:41 AM [place], RN, Sherre Poot Reason: She  will go to ER instead of calling 911 Final Disposition 02/28/2022 10:45:03 AM Call EMS 911 Now Yes Markus Daft, RN, Sherre Poot Caller Disagree/Comply Comply Caller Understands Yes PreDisposition Search internet for information Care Advice Given Per Guideline CALL EMS 911 NOW: * Immediate medical attention is needed. You need to hang up and call 911 (or an ambulance). CARE ADVICE given per Chest Pain (Adult) guideline. Comments User: Mayford Knife, RN Date/Time Eilene Ghazi Time): 02/28/2022 10:44:23 AM Underlying constant DB, but not as severe. Took ASA when it happened, and did not seem to help

## 2022-02-28 NOTE — ED Provider Notes (Signed)
[facility] DEPT Provider Note   CSN: [id number] Arrival date & time: 02/28/22  1122     History  Chief Complaint  Patient presents with   Chest Pain    Alexina Niccoli is a 56 y.o. female history of hypertension, here presenting with chest pain.  Patient states that she woke up last night with left-sided chest pain.  She states that she has some subjective shortness of breath as well.  Patient denies any recent travel history of blood clots.  Patient has history of rheumatoid arthritis and is on methotrexate.  She was concerned that she may have some inflammation in her lungs.  Denies any fevers or chills or cough.  The history is provided by the patient.       Home Medications Prior to Admission medications   Medication Sig Start Date End Date Taking? Authorizing Provider  Aspirin-Acetaminophen-Caffeine (EXCEDRIN PO) Take by mouth.    [provider]  folic acid (FOLVITE) 1 MG tablet Take 1 mg by mouth daily. 08/10/19   [provider]  hydrochlorothiazide (HYDRODIURIL) 25 MG tablet TAKE 1 TABLET BY MOUTH EVERY DAY 02/18/22   Leamon Arnt, MD  methotrexate (RHEUMATREX) 2.5 MG tablet TK 9 TS PO 1 TIME A WEEK 11/30/14   [provider]  metoprolol succinate (TOPROL-XL) 25 MG 24 hr tablet TAKE 1/2 TABLET BY MOUTH EVERY DAY 10/23/21   Leamon Arnt, MD  naproxen (NAPROSYN) 500 MG tablet Take 1 tablet by mouth as needed.  11/30/14   [provider]      Allergies    Shingrix [zoster vac recomb adjuvanted]    Review of Systems   Review of Systems  Cardiovascular:  Positive for chest pain.  All other systems reviewed and are negative.   Physical Exam Updated Vital Signs BP 131/88   Pulse 70   Temp 98.3 F (36.8 C) (Oral)   Resp 18   Ht '5\' 3"'$  (1.6 m)   Wt 107 kg   LMP 04/25/2011   SpO2 96%   BMI 41.81 kg/m  Physical Exam Vitals and nursing note reviewed.  Constitutional:      Appearance: She is  well-developed.  HENT:     Head: Normocephalic.  Eyes:     Extraocular Movements: Extraocular movements intact.     Pupils: Pupils are equal, round, and reactive to light.  Cardiovascular:     Rate and Rhythm: Normal rate and regular rhythm.     Heart sounds: Normal heart sounds.  Pulmonary:     Effort: Pulmonary effort is normal.     Breath sounds: Normal breath sounds.  Chest:     Comments: Mild producible left chest wall tenderness Abdominal:     General: Bowel sounds are normal.     Palpations: Abdomen is soft.  Musculoskeletal:        General: Normal range of motion.     Cervical back: Normal range of motion and neck supple.  Skin:    General: Skin is warm.     Capillary Refill: Capillary refill takes less than 2 seconds.  Neurological:     General: No focal deficit present.     Mental Status: She is alert and oriented to person, place, and time.  Psychiatric:        Mood and Affect: Mood normal.        Behavior: Behavior normal.     ED Results / Procedures / Treatments   Labs (all labs ordered are  listed, but only abnormal results are displayed) Labs Reviewed  BASIC METABOLIC PANEL - Abnormal; Notable for the following components:      Result Value   Glucose, Bld 119 (*)    All other components within normal limits  CBC  TROPONIN I (HIGH SENSITIVITY)  TROPONIN I (HIGH SENSITIVITY)    EKG EKG Interpretation  Date/Time:  Thursday February 28 2022 11:30:23 EDT Ventricular Rate:  87 PR Interval:  136 QRS Duration: 93 QT Interval:  349 QTC Calculation: 420 R Axis:   81 Text Interpretation: Sinus rhythm No previous ECGs available Confirmed by Wandra Arthurs [phone]) on 02/28/2022 3:02:55 PM  Radiology CT Angio Chest PE W and/or Wo Contrast  Result Date: 02/28/2022 CLINICAL DATA:  Pulmonary embolism suspected.  Chest pain. EXAM: CT ANGIOGRAPHY CHEST WITH CONTRAST TECHNIQUE: Multidetector CT imaging of the chest was performed using the standard protocol during  bolus administration of intravenous contrast. Multiplanar CT image reconstructions and MIPs were obtained to evaluate the vascular anatomy. RADIATION DOSE REDUCTION: This exam was performed according to the departmental dose-optimization program which includes automated exposure control, adjustment of the mA and/or kV according to patient size and/or use of iterative reconstruction technique. CONTRAST:  145m OMNIPAQUE IOHEXOL 350 MG/ML SOLN COMPARISON:  Chest x-ray on 02/28/2022 FINDINGS: Cardiovascular: Heart size is normal. No pericardial effusion. Pulmonary arteries are well opacified and there is no acute pulmonary embolus. Mediastinum/Nodes: LEFT low-attenuation thyroid nodule is 2.3 centimeters. The esophagus is unremarkable. No significant mediastinal, hilar, or axillary adenopathy. Lungs/Pleura: Airways are patent. Within the RIGHT UPPER lobe, there is a 4 millimeter nodule seen on image 29 of series 6. 2-3 millimeter pulmonary nodules are identified in the RIGHT UPPER lobe and LEFT UPPER lobe (images 61 and 62 of series 6). There are no pleural effusions. No consolidations or pulmonary edema. There is minimal dependent atelectasis. Upper Abdomen: The liver is diffusely low attenuation consistent with hepatic steatosis. Otherwise, UPPER abdomen is unremarkable. Musculoskeletal: No chest wall abnormality. No acute or significant osseous findings. Review of the MIP images confirms the above findings. IMPRESSION: 1. No acute pulmonary embolism. 2. 2.3 centimeter LEFT thyroid nodule. Recommend nonemergent thyroid [place] Reference: J Am Coll Radiol. 2015 Feb;12(2): 143-50 3. Pulmonary nodules, measuring 4 millimeters and smaller. Multiple pulmonary nodules. Most significant: 4 mm right solid pulmonary nodule within the upper lobe. Per Fleischner Society Guidelines, if patient is low risk for malignancy, no routine follow-up imaging is recommended; if patient is high risk for malignancy, a non-contrast Chest CT at  12 months is optional. If performed and the nodule is stable at 12 months, no further follow-up is recommended. These guidelines do not apply to immunocompromised patients and patients with cancer. Follow up in patients with significant comorbidities as clinically warranted. For lung cancer screening, adhere to Lung-RADS guidelines. Reference: Radiology. 2017; 284(1):228-43. 4. Hepatic steatosis. Electronically Signed   By: ENolon NationsM.D.   On: 02/28/2022 15:49   DG Chest 2 View  Result Date: 02/28/2022 CLINICAL DATA:  Chest pain. EXAM: CHEST - 2 VIEW COMPARISON:  Chest x-ray 06/06/2017. FINDINGS: The heart size and mediastinal contours are within normal limits. Both lungs are clear. No visible pleural effusions or pneumothorax. No acute osseous abnormality. Low lung volumes. IMPRESSION: No active cardiopulmonary disease. Electronically Signed   By: FMargaretha SheffieldM.D.   On: 02/28/2022 11:56    Procedures Procedures    Medications Ordered in ED Medications  sodium chloride (PF) 0.9 % injection (has no administration in time  range)  iohexol (OMNIPAQUE) 350 MG/ML injection 100 mL (100 mLs Intravenous Contrast Given 02/28/22 1529)    ED Course/ Medical Decision Making/ A&P                           Medical Decision Making Gissell Barra is a 56 y.o. female here presenting with chest pain.  Patient has left-sided chest pain since yesterday.  It is pleuritic in nature.  Also it is reproducible.  Concern for possible ACS versus PE versus musculoskeletal pain.  Plan to get CBC and BMP and troponin x2 and CTA chest.  4:13 PM I reviewed patient's labs and independently interpreted CT scan.  Patient has no PE.  Patient has incidental thyroid and pulmonary nodules.  At this point, I think she most likely have chest wall pain, patient can be discharged.    Problems Addressed: Chest wall pain: acute illness or injury Lung nodule: acute illness or injury Thyroid nodule: acute illness or  injury  Amount and/or Complexity of Data Reviewed Labs: ordered. Decision-making details documented in ED Course. Radiology: ordered and independent interpretation performed. Decision-making details documented in ED Course. ECG/medicine tests: ordered and independent interpretation performed. Decision-making details documented in ED Course.  Risk Prescription drug management.    Final Clinical Impression(s) / ED Diagnoses Final diagnoses:  None    Rx / DC Orders ED Discharge Orders     None         Drenda Freeze, MD 02/28/22 1615

## 2022-02-28 NOTE — ED Provider Triage Note (Signed)
Emergency Medicine Provider Triage Evaluation Note  Monique Carney , a 56 y.o. female  was evaluated in triage.  Pt complains of substernal chest pain.  Patient states her pain began at 1 AM.  She woke up and felt a 7 out of 10 pain in the substernal region with no radiation.  Patient states that when she moved it was worse.  She also endorses significant increase in pain with coughing or deep inspiration last night.  Patient states that this morning she had a continuation of the pain but that the baseline pain is now much less severe rated at 0.5 out of 10 in severity.  Patient states that with a cough or deep breath her pain increases upwards of 8 or 9 out of 10 in severity.  She denies nausea, vomiting, abdominal pain, shortness of breath  Review of Systems  Positive: As above Negative: As above  Physical Exam  BP (!) 174/108 (BP Location: Left Arm)   Pulse 97   Temp 98.3 F (36.8 C) (Oral)   Resp 16   Ht '5\' 3"'$  (1.6 m)   Wt 107 kg   LMP 04/25/2011   SpO2 96%   BMI 41.81 kg/m  Gen:   Awake, no distress   Resp:  Normal effort  MSK:   Moves extremities without difficulty  Other:    Medical Decision Making  Medically screening exam initiated at 12:26 PM.  Appropriate orders placed.  Dellis Anes was informed that the remainder of the evaluation will be completed by another provider, this initial triage assessment does not replace that evaluation, and the importance of remaining in the ED until their evaluation is complete.     Dorothyann Peng, PA-C 02/28/22 1227

## 2022-02-28 NOTE — ED Triage Notes (Signed)
Patient c/o mid chest pain at 0100 this AM. Patient states she took 2 81 mg Aspirin and repeated at 0930 this AM Patient states pain is worse with taking a deep breath or coughing. Patient denies any SOB, dizziness, weakness.

## 2022-02-28 NOTE — Discharge Instructions (Addendum)
Take Tylenol or Motrin for pain.  Take Flexeril for muscle spasms.  If you have persistent chest pain, you need to talk to your doctor to get the stress test.  You also have incidental lung and thyroid nodules that can be follow-up with your doctor  Return to ER if you have worse chest pain, shortness of breath, fever

## 2022-02-28 NOTE — ED Notes (Signed)
Pt states understanding of dc instructions, importance of follow up, and prescription. Pt denies questions or concerns upon dc. Pt declined wheelchair assistance upon dc. Pt ambulated out of ed w/ steady gait. No belongings left in room upon dc.  

## 2022-03-01 ENCOUNTER — Encounter: Payer: Self-pay | Admitting: Family Medicine

## 2022-03-01 DIAGNOSIS — R918 Other nonspecific abnormal finding of lung field: Secondary | ICD-10-CM | POA: Insufficient documentation

## 2022-03-01 DIAGNOSIS — E041 Nontoxic single thyroid nodule: Secondary | ICD-10-CM | POA: Insufficient documentation

## 2022-03-04 ENCOUNTER — Ambulatory Visit (INDEPENDENT_AMBULATORY_CARE_PROVIDER_SITE_OTHER): Payer: BC Managed Care – PPO | Admitting: Family Medicine

## 2022-03-04 ENCOUNTER — Encounter: Payer: Self-pay | Admitting: Family Medicine

## 2022-03-04 VITALS — BP 124/86 | HR 86 | Temp 97.8°F | Ht 63.0 in | Wt 237.2 lb

## 2022-03-04 DIAGNOSIS — E041 Nontoxic single thyroid nodule: Secondary | ICD-10-CM

## 2022-03-04 DIAGNOSIS — Z8249 Family history of ischemic heart disease and other diseases of the circulatory system: Secondary | ICD-10-CM

## 2022-03-04 DIAGNOSIS — M0579 Rheumatoid arthritis with rheumatoid factor of multiple sites without organ or systems involvement: Secondary | ICD-10-CM

## 2022-03-04 DIAGNOSIS — I10 Essential (primary) hypertension: Secondary | ICD-10-CM | POA: Diagnosis not present

## 2022-03-04 DIAGNOSIS — Z23 Encounter for immunization: Secondary | ICD-10-CM

## 2022-03-04 DIAGNOSIS — K219 Gastro-esophageal reflux disease without esophagitis: Secondary | ICD-10-CM

## 2022-03-04 DIAGNOSIS — M940 Chondrocostal junction syndrome [Tietze]: Secondary | ICD-10-CM | POA: Diagnosis not present

## 2022-03-04 DIAGNOSIS — R9431 Abnormal electrocardiogram [ECG] [EKG]: Secondary | ICD-10-CM

## 2022-03-04 DIAGNOSIS — R918 Other nonspecific abnormal finding of lung field: Secondary | ICD-10-CM | POA: Diagnosis not present

## 2022-03-04 MED ORDER — METOPROLOL SUCCINATE ER 25 MG PO TB24: 25.0000 mg | ORAL_TABLET | Freq: Every day | ORAL | Status: DC

## 2022-03-04 MED ORDER — OMEPRAZOLE 20 MG PO CPDR: 20.0000 mg | DELAYED_RELEASE_CAPSULE | Freq: Every day | ORAL | 2 refills | Status: DC

## 2022-03-04 NOTE — Progress Notes (Signed)
Subjective  CC:  Chief Complaint  Patient presents with   Hospitalization Follow-up    Pt stated that she felt like she had a pulled muscle and has been advised to have a stress test.    HPI: Monique Carney is a 56 y.o. female who presents to the office today to address the problems listed above in the chief complaint. Acute ED visit ED.  Patient had atypical chest pain.  Describes awakening at 1 AM in the morning due to chest pain.  Pain was substernal and worse with certain position changes, coughing and deep breathing.  She was able to get back to sleep but this pain persisted throughout the morning.  She was sent to the emergency room for evaluation for ACS.  Emergency room eval showed normal EKG except for T wave inversions precordial leads which was stable from years prior.  Lab work was unremarkable including 2 negative troponins.  Chest CT ruled out PE but did show incidental thyroid nodule and pulmonary nodules.  Please see report.  She was discharged with probable diagnosis of costochondritis or pleurisy given findings on physical exam.  Treated with NSAIDs.  She reports that within 24 hours her symptoms are mostly gone.  She did not need the anti-inflammatories.  She denies exertional chest pain, shortness of breath.  She had no fevers.  No prior cough or URI symptoms.  She is at risk for cardiovascular disease given morbid obesity, hypertension and a strong family history associated with untreated hyperlipidemia.  She was working on diet and cholesterol-lowering and is due for recheck. Hypertension f/u: Control is fair . Pt reports she is doing well.  Blood pressure was elevated in the ER. She denies adverse effects from his BP medications. Compliance with medication is good. She takes metoprolol and HCTZ daily. Incidental thyroid nodule 2 cm, patient without symptoms.  Normal recent TSH. Small pulmonary nodules and low risk patient for lung cancer.  Non-smoker.  No COPD.  No further  imaging recommended RA on methotrexate: No pneumonitis identified on chest CT. Also with active GERD symptoms worse with lying down.  Has noted 2-3 times per week since regaining some weight back.  Not taking anything.  Assessment  1. Costochondritis   2. Thyroid nodule greater than or equal to 1.5 cm in diameter incidentally noted on imaging study   3. Family history of premature CAD   4. Pulmonary nodules   5. Morbid obesity ([place])   6. Essential hypertension   7. Need for influenza vaccination   8. Rheumatoid arthritis involving multiple sites with positive rheumatoid factor ([place]), on MTX   9. Gastroesophageal reflux disease, unspecified whether esophagitis present      Plan   Hypertension f/u: BP control is fairly well controlled.  We will continue current medications and monitor Costochondritis by exam.  Atypical for cardiac related chest pain.  Reassured.  Still symptomatic with physical exam but otherwise improved.  May use Advil if needed.  Otherwise will monitor.  Primary prevention for cardiovascular disease discussed.  We will check fasting lipids and treat if remains elevated.  Work on low-fat low-salt diet.  Continue blood pressure control.  Recommend weight loss Thyroid ultrasound ordered to further evaluate the thyroid and thyroid nodule.  Thyroid function tests have been normal. Pulmonary nodules are benign GERD: Start omeprazole 20 mg daily.  I spent a total of 42 minutes for this patient encounter. Time spent included preparation, face-to-face counseling with the patient and coordination of care,  review of chart and records, and documentation of the encounter.  Education regarding management of these chronic disease states was given. Management strategies discussed on successive visits include dietary and exercise recommendations, goals of achieving and maintaining IBW, and lifestyle modifications aiming for adequate sleep and minimizing stressors.   Follow up: 3 months  for recheck bp  Orders Placed This Encounter  Procedures   US THYROID   Flu Vaccine QUAD 6+ mos PF IM (Fluarix Quad PF)   Lipid panel   Hemoglobin A1c   Meds ordered this encounter  Medications   metoprolol succinate (TOPROL-XL) 25 MG 24 hr tablet    Sig: Take 1 tablet (25 mg total) by mouth daily.   omeprazole (PRILOSEC) 20 MG capsule    Sig: Take 1 capsule (20 mg total) by mouth daily.    Dispense:  30 capsule    Refill:  2      BP Readings from Last 3 Encounters:  03/04/22 124/86  02/28/22 (!) 140/86  09/25/21 122/86   Wt Readings from Last 3 Encounters:  03/04/22 237 lb 3.2 oz (107.6 kg)  02/28/22 236 lb (107 kg)  09/25/21 226 lb 6.4 oz (102.7 kg)    Lab Results  Component Value Date   CHOL 262 (H) 09/25/2021   CHOL 211 (H) 09/08/2020   CHOL 204 (H) 09/08/2019   Lab Results  Component Value Date   HDL 44.30 09/25/2021   HDL 42.70 09/08/2020   HDL 44.30 09/08/2019   Lab Results  Component Value Date   LDLCALC 181 (H) 09/25/2021   LDLCALC 128 (H) 09/08/2019   LDLCALC 111 07/03/2018   Lab Results  Component Value Date   TRIG 188.0 (H) 09/25/2021   TRIG 231.0 (H) 09/08/2020   TRIG 161.0 (H) 09/08/2019   Lab Results  Component Value Date   CHOLHDL 6 09/25/2021   CHOLHDL 5 09/08/2020   CHOLHDL 5 09/08/2019   Lab Results  Component Value Date   LDLDIRECT 141.0 09/08/2020   Lab Results  Component Value Date   CREATININE 0.84 02/28/2022   BUN 12 02/28/2022   NA 139 02/28/2022   K 4.0 02/28/2022   CL 107 02/28/2022   CO2 25 02/28/2022    The 10-year ASCVD risk score (Arnett DK, et al., 2019) is: 4.7%   Values used to calculate the score:     Age: 32 years     Sex: Female     Is Non-Hispanic African American: No     Diabetic: No     Tobacco smoker: No     Systolic Blood Pressure: 102 mmHg     Is BP treated: Yes     HDL Cholesterol: 44.3 mg/dL     Total Cholesterol: 262 mg/dL  I reviewed the patients updated PMH, FH, and SocHx.     Patient Active Problem List   Diagnosis Date Noted   Thyroid nodule greater than or equal to 1.5 cm in diameter incidentally noted on imaging study 03/01/2022   Pulmonary nodules 03/01/2022   Hyperplastic colonic polyp 03/16/2021   Family history of premature CAD 09/08/2019   Rheumatoid arthritis involving multiple sites with positive rheumatoid factor ([place]), on MTX 03/08/2017   Essential hypertension 01/16/2017   Morbid obesity ([place]) 01/16/2017    Allergies: Shingrix [zoster vac recomb adjuvanted]  Social History: Patient  reports that she has quit smoking. Her smoking use included cigarettes. She has never used smokeless tobacco. She reports current alcohol use of about 5.0 standard drinks of alcohol  per week. She reports that she does not use drugs.  Current Meds  Medication Sig   Aspirin-Acetaminophen-Caffeine (EXCEDRIN PO) Take by mouth.   cyclobenzaprine (FLEXERIL) 5 MG tablet Take 1 tablet (5 mg total) by mouth 3 (three) times daily as needed for muscle spasms.   folic acid (FOLVITE) 1 MG tablet Take 1 mg by mouth daily.   hydrochlorothiazide (HYDRODIURIL) 25 MG tablet TAKE 1 TABLET BY MOUTH EVERY DAY   methotrexate (RHEUMATREX) 2.5 MG tablet TK 9 TS PO 1 TIME A WEEK   naproxen (NAPROSYN) 500 MG tablet Take 1 tablet by mouth as needed.    omeprazole (PRILOSEC) 20 MG capsule Take 1 capsule (20 mg total) by mouth daily.   [DISCONTINUED] metoprolol succinate (TOPROL-XL) 25 MG 24 hr tablet TAKE 1/2 TABLET BY MOUTH EVERY DAY (Patient taking differently: Take 25 mg by mouth daily.)    Review of Systems: Cardiovascular: negative for chest pain, palpitations, leg swelling, orthopnea Respiratory: negative for SOB, wheezing or persistent cough Gastrointestinal: negative for abdominal pain Genitourinary: negative for dysuria or gross hematuria  Objective  Vitals: BP 124/86   Pulse 86   Temp 97.8 F (36.6 C)   Ht '5\' 3"'$  (1.6 m)   Wt 237 lb 3.2 oz (107.6 kg)   LMP 04/25/2011    SpO2 97%   BMI 42.02 kg/m  General: no acute distress, appears well, comfortable Psych:  Alert and oriented, normal mood and affect HEENT:  Normocephalic, atraumatic, supple neck  Cardiovascular:  RRR without murmur. no edema, reproducible left-sided costochondral tenderness on exam Respiratory:  Good breath sounds bilaterally, CTAB with normal respiratory effort Gastrointestinal: soft, flat abdomen, normal active bowel sounds, no palpable masses, no hepatosplenomegaly, no appreciated hernias Nontender Skin:  Warm, no rashes Neurologic:   Mental status is normal Commons side effects, risks, benefits, and alternatives for medications and treatment plan prescribed today were discussed, and the patient expressed understanding of the given instructions. Patient is instructed to call or message via MyChart if he/she has any questions or concerns regarding our treatment plan. No barriers to understanding were identified. We discussed Red Flag symptoms and signs in detail. Patient expressed understanding regarding what to do in case of urgent or emergency type symptoms.  Medication list was reconciled, printed and provided to the patient in AVS. Patient instructions and summary information was reviewed with the patient as documented in the AVS. This note was prepared with assistance of Dragon voice recognition software. Occasional wrong-word or sound-a-like substitutions may have occurred due to the inherent limitations of voice recognition software  This visit occurred during the SARS-CoV-2 public health emergency.  Safety protocols were in place, including screening questions prior to the visit, additional usage of staff PPE, and extensive cleaning of exam room while observing appropriate contact time as indicated for disinfecting solutions.

## 2022-03-04 NOTE — Patient Instructions (Signed)
Please return in 3 months to recheck your blood pressure.   I will release your lab results to you on your MyChart account with further instructions. You may see the results before I do, but when I review them I will send you a message with my report or have my assistant call you if things need to be discussed. Please reply to my message with any questions. Thank you!   We will call you to schedule an ultrasound of your thyroid.   If you have any questions or concerns, please don't hesitate to send me a message via MyChart or call the office at [phone]. Thank you for visiting with Monique Carney today! It's our pleasure caring for you.   Costochondritis  Costochondritis is inflammation of the tissue (cartilage) that connects the ribs to the breastbone (sternum). This causes pain in the front of the chest. The pain usually starts slowly and involves more than one rib. What are the causes? The exact cause of this condition is not always known. It results from stress on the cartilage where your ribs attach to your sternum. The cause of this stress could be: Chest injury. Exercise or activity, such as lifting. Severe coughing. What increases the risk? You are more likely to develop this condition if you: Are female. Are 3-52 years old. Recently started a new exercise or work activity. Have low levels of vitamin D. Have a condition that makes you cough frequently. What are the signs or symptoms? The main symptom of this condition is chest pain. The pain: Usually starts gradually and can be sharp or dull. Gets worse with deep breathing, coughing, or exercise. Gets better with rest. May be worse when you press on the affected area of your ribs and sternum. How is this diagnosed? This condition is diagnosed based on your symptoms, your medical history, and a physical exam. Your health care provider will check for pain when pressing on your sternum. You may also have tests to rule out other causes of  chest pain. These may include: A chest X-ray to check for lung problems. An ECG (electrocardiogram) to see if you have a heart problem that could be causing the pain. An imaging scan to rule out a chest or rib fracture. How is this treated? This condition usually goes away on its own over time. Your health care provider may prescribe an NSAID, such as ibuprofen, to reduce pain and inflammation. Treatment may also include: Resting and avoiding activities that make pain worse. Applying heat or ice to the area to reduce pain and inflammation. Doing exercises to stretch your chest muscles. If these treatments do not help, your health care provider may inject a numbing medicine at the sternum-rib connection to help relieve the pain. Follow these instructions at home: Managing pain, stiffness, and swelling     If directed, put ice on the painful area. To do this: Put ice in a plastic bag. Place a towel between your skin and the bag. Leave the ice on for 20 minutes, 2-3 times a day. If directed, apply heat to the affected area as often as told by your health care provider. Use the heat source that your health care provider recommends, such as a moist heat pack or a heating pad. Place a towel between your skin and the heat source. Leave the heat on for 20-30 minutes. Remove the heat if your skin turns bright red. This is especially important if you are unable to feel pain, heat, or cold. You  may have a greater risk of getting burned. Activity Rest as told by your health care provider. Avoid activities that make pain worse. This includes any activities that use chest, abdominal, and side muscles. Do not lift anything that is heavier than 10 lb (4.5 kg), or the limit that you are told, until your health care provider says that it is safe. Return to your normal activities as told by your health care provider. Ask your health care provider what activities are safe for you. General instructions Take  over-the-counter and prescription medicines only as told by your health care provider. Keep all follow-up visits as told by your health care provider. This is important. Contact a health care provider if: You have chills or a fever. Your pain does not go away or it gets worse. You have a cough that does not go away. Get help right away if: You have shortness of breath. You have severe chest pain that is not relieved by medicines, heat, or ice. These symptoms may represent a serious problem that is an emergency. Do not wait to see if the symptoms will go away. Get medical help right away. Call your local emergency services (911 in the [place] Do not drive yourself to the hospital.  Summary Costochondritis is inflammation of the tissue (cartilage) that connects the ribs to the breastbone (sternum). This condition causes pain in the front of the chest. Costochondritis results from stress on the cartilage where your ribs attach to your sternum. Treatment may include medicines, rest, heat or ice, and exercises. This information is not intended to replace advice given to you by your health care provider. Make sure you discuss any questions you have with your health care provider. Document Revised: 08/28/2021 Document Reviewed: 04/23/2019 Elsevier Patient Education  [place].  Thyroid Nodule  A thyroid nodule is an isolated growth of thyroid cells that forms a lump in the thyroid gland. The thyroid gland is a butterfly-shaped gland found in the lower front of the neck. It sends chemical messengers (hormones) through the blood to all parts of the body. These hormones are important in regulating body temperature and helping the body use energy. Thyroid nodules are common. Most are not cancerous (are benign). You may have one nodule or several nodules. There are different types of thyroid nodules. They include nodules that: Grow and fill with fluid (thyroid cysts). Produce too much thyroid  hormone (hot nodules or hyperthyroid). Produce no thyroid hormone (cold nodules or hypothyroid). Form from cancer cells (thyroid cancers). What are the causes? In most cases, the cause of thyroid nodules is not known. What increases the risk? The following factors may make you more likely to develop thyroid nodules: Age. Thyroid nodules are more common in people who are older than 45 years. Female gender. A family history that includes: Thyroid nodules. Pheochromocytoma. Thyroid carcinoma. Hyperparathyroidism. Certain thyroid diseases, such as Hashimoto's thyroiditis. Lack of iodine in your diet. A history of head and neck radiation, such as from cancer treatments. Type 2 diabetes. What are the signs or symptoms? In many cases, there are no symptoms. If you have symptoms, they may include: A lump in your lower neck. Feeling pressure, fullness, or a tickle in your throat. Pain in your neck, jaw, or ear. Having trouble swallowing or breathing. Hot nodules may cause: Weight loss. Warm, flushed skin. Feeling hot. Feeling nervous. A rapid or irregular heartbeat. Cold nodules may cause: Weight gain. Dry skin. Hair loss, brittle hair, or both. Feeling cold. Fatigue.  Thyroid cancer nodules may cause: Hard nodules that can be felt along the thyroid gland. Hoarseness. Lumps in the tissue (lymph nodes) near your thyroid gland. How is this diagnosed? A thyroid nodule may be felt by your health care provider during a physical exam. This condition may also be diagnosed based on your symptoms. You may also have tests, including: Blood tests to check how well your thyroid is working. An ultrasound. This may be done to confirm the diagnosis. A biopsy. This involves taking a sample from the nodule and looking at it under a microscope. A thyroid scan. This test creates an image of the thyroid gland using a radioactive tracer. Imaging tests such as an MRI or CT scan. These may be done  if: A nodule is large. A nodule is blocking your airway. Cancer is suspected. How is this treated? Treatment depends on the cause and size of your nodule or nodules. If a nodule is benign, treatment may not be necessary. Your health care provider may monitor the nodule to see if it goes away without treatment. If a nodule continues to grow, is cancerous, or does not go away, treatment may be needed. Treatment may include: Having a cystic nodule drained with a needle. Ablation therapy. In this treatment, alcohol is injected into the area of the nodule to destroy the cells. Ablation with heat may also be used. This is called thermal ablation. Radioactive iodine. In this treatment, radioactive iodine is given as a pill or liquid that you drink. This substance causes the thyroid nodule to shrink. Surgery to remove the nodule or nodules. Part or all of your thyroid gland may also need to be removed. Medicines to treat hyperthyroidism. Follow these instructions at home: Pay attention to any changes in your thyroid nodule or nodules. Take over-the-counter and prescription medicines only as told by your health care provider. Keep all follow-up visits. This is important. Contact a health care provider if: You have trouble sleeping. You have muscle weakness. You have significant weight loss without changing your eating habits. You feel nervous. You have trouble swallowing. You have increased swelling. You have a rapid or irregular heartbeat. Get help right away if: You have chest pain. You faint or lose consciousness. Your nodule makes it hard for you to breathe. These symptoms may be an emergency. Get help right away. Call 911. Do not wait to see if the symptoms will go away. Do not drive yourself to the hospital. Summary A thyroid nodule is an isolated growth of thyroid cells that forms a lump in your thyroid gland. Thyroid nodules are common. Most are not cancerous. Your health care  provider may monitor the nodule to see if it goes away without treatment. If a nodule continues to grow, is cancerous, or does not go away, treatment may be needed. Treatment depends on the cause and size of your nodule or nodules. This information is not intended to replace advice given to you by your health care provider. Make sure you discuss any questions you have with your health care provider. Document Revised: 04/23/2021 Document Reviewed: 04/23/2021 Elsevier Patient Education  [place].

## 2022-03-05 LAB — LIPID PANEL
Cholesterol: 250 mg/dL — ABNORMAL HIGH (ref 0–200)
HDL: 48.2 mg/dL (ref >39.00)
LDL Cholesterol: 162 mg/dL — ABNORMAL HIGH (ref 0–99)
NonHDL: 201.91
Total CHOL/HDL Ratio: 5
Triglycerides: 200 mg/dL — ABNORMAL HIGH (ref 0.0–149.0)
VLDL: 40 mg/dL (ref 0.0–40.0)

## 2022-03-05 LAB — HEMOGLOBIN A1C: Hgb A1c MFr Bld: 6 % (ref 4.6–6.5)

## 2022-03-07 DIAGNOSIS — M0589 Other rheumatoid arthritis with rheumatoid factor of multiple sites: Secondary | ICD-10-CM | POA: Diagnosis not present

## 2022-03-11 ENCOUNTER — Encounter: Payer: Self-pay | Admitting: Family Medicine

## 2022-03-11 ENCOUNTER — Ambulatory Visit
Admission: RE | Admit: 2022-03-11 | Discharge: 2022-03-11 | Disposition: A | Discharge status: Home / Self Care | Payer: BC Managed Care – PPO | Source: Ambulatory Visit | Attending: Family Medicine | Admitting: Family Medicine

## 2022-03-11 DIAGNOSIS — R7303 Prediabetes: Secondary | ICD-10-CM | POA: Insufficient documentation

## 2022-03-11 DIAGNOSIS — E041 Nontoxic single thyroid nodule: Secondary | ICD-10-CM | POA: Diagnosis not present

## 2022-03-11 DIAGNOSIS — E782 Mixed hyperlipidemia: Secondary | ICD-10-CM | POA: Insufficient documentation

## 2022-03-11 HISTORY — DX: Prediabetes: R73.03

## 2022-03-11 MED ORDER — ROSUVASTATIN CALCIUM 10 MG PO TABS: 10.0000 mg | ORAL_TABLET | Freq: Every day | ORAL | 3 refills | Status: DC

## 2022-03-11 NOTE — Addendum Note (Signed)
Addended by: Billey Chang on: 03/11/2022 10:00 AM   Modules accepted: Orders

## 2022-03-13 ENCOUNTER — Other Ambulatory Visit: Payer: Self-pay

## 2022-03-13 DIAGNOSIS — E041 Nontoxic single thyroid nodule: Secondary | ICD-10-CM

## 2022-04-12 ENCOUNTER — Other Ambulatory Visit: Payer: Self-pay | Admitting: Home Modifications

## 2022-04-12 DIAGNOSIS — E041 Nontoxic single thyroid nodule: Secondary | ICD-10-CM

## 2022-05-01 DIAGNOSIS — Z1231 Encounter for screening mammogram for malignant neoplasm of breast: Secondary | ICD-10-CM | POA: Diagnosis not present

## 2022-05-01 LAB — HM MAMMOGRAPHY

## 2022-05-06 ENCOUNTER — Encounter: Payer: Self-pay | Admitting: Family Medicine

## 2022-05-07 ENCOUNTER — Other Ambulatory Visit (HOSPITAL_COMMUNITY)
Admission: RE | Admit: 2022-05-07 | Discharge: 2022-05-07 | Disposition: A | Discharge status: Home / Self Care | Payer: BC Managed Care – PPO | Source: Ambulatory Visit | Attending: Home Modifications | Admitting: Home Modifications

## 2022-05-07 ENCOUNTER — Ambulatory Visit
Admission: RE | Admit: 2022-05-07 | Discharge: 2022-05-07 | Disposition: A | Discharge status: Home / Self Care | Payer: BC Managed Care – PPO | Source: Ambulatory Visit | Attending: Home Modifications | Admitting: Home Modifications

## 2022-05-07 DIAGNOSIS — E041 Nontoxic single thyroid nodule: Secondary | ICD-10-CM | POA: Insufficient documentation

## 2022-05-09 LAB — CYTOLOGY - NON PAP

## 2022-05-13 ENCOUNTER — Other Ambulatory Visit: Payer: Self-pay | Admitting: Family Medicine

## 2022-06-05 ENCOUNTER — Telehealth: Payer: Self-pay | Admitting: Family Medicine

## 2022-06-05 ENCOUNTER — Ambulatory Visit: Payer: BC Managed Care – PPO | Admitting: Family Medicine

## 2022-06-05 NOTE — Telephone Encounter (Signed)
Pt has been made aware of mammo results

## 2022-06-05 NOTE — Telephone Encounter (Signed)
Pt would like to get her results from her mammogram & biopsy from November. She is available all day today but not between 2-2:30 pm. She would really like a call today if possible.

## 2022-06-08 ENCOUNTER — Other Ambulatory Visit: Payer: Self-pay | Admitting: Family Medicine

## 2022-06-11 DIAGNOSIS — M1990 Unspecified osteoarthritis, unspecified site: Secondary | ICD-10-CM | POA: Diagnosis not present

## 2022-06-11 DIAGNOSIS — M0589 Other rheumatoid arthritis with rheumatoid factor of multiple sites: Secondary | ICD-10-CM | POA: Diagnosis not present

## 2022-06-11 DIAGNOSIS — Z79899 Other long term (current) drug therapy: Secondary | ICD-10-CM | POA: Diagnosis not present

## 2022-07-05 ENCOUNTER — Ambulatory Visit: Payer: BC Managed Care – PPO | Admitting: Family Medicine

## 2022-08-08 ENCOUNTER — Other Ambulatory Visit: Payer: Self-pay | Admitting: Family Medicine

## 2022-10-01 ENCOUNTER — Encounter: Payer: BC Managed Care – PPO | Admitting: Family Medicine

## 2022-10-03 ENCOUNTER — Encounter: Payer: Self-pay | Admitting: Family Medicine

## 2022-10-03 ENCOUNTER — Ambulatory Visit (INDEPENDENT_AMBULATORY_CARE_PROVIDER_SITE_OTHER): Payer: BC Managed Care – PPO | Admitting: Family Medicine

## 2022-10-03 VITALS — BP 110/84 | HR 80 | Temp 97.8°F | Ht 63.0 in | Wt 224.0 lb

## 2022-10-03 DIAGNOSIS — E782 Mixed hyperlipidemia: Secondary | ICD-10-CM | POA: Diagnosis not present

## 2022-10-03 DIAGNOSIS — I10 Essential (primary) hypertension: Secondary | ICD-10-CM

## 2022-10-03 DIAGNOSIS — Z0000 Encounter for general adult medical examination without abnormal findings: Secondary | ICD-10-CM | POA: Diagnosis not present

## 2022-10-03 DIAGNOSIS — E041 Nontoxic single thyroid nodule: Secondary | ICD-10-CM

## 2022-10-03 DIAGNOSIS — R7303 Prediabetes: Secondary | ICD-10-CM | POA: Diagnosis not present

## 2022-10-03 DIAGNOSIS — K635 Polyp of colon: Secondary | ICD-10-CM

## 2022-10-03 DIAGNOSIS — M0579 Rheumatoid arthritis with rheumatoid factor of multiple sites without organ or systems involvement: Secondary | ICD-10-CM

## 2022-10-03 DIAGNOSIS — K219 Gastro-esophageal reflux disease without esophagitis: Secondary | ICD-10-CM

## 2022-10-03 LAB — CBC WITH DIFFERENTIAL/PLATELET
Basophils Absolute: 0 10*3/uL (ref 0.0–0.1)
Basophils Relative: 0.6 % (ref 0.0–3.0)
Eosinophils Absolute: 0.2 10*3/uL (ref 0.0–0.7)
Eosinophils Relative: 3.7 % (ref 0.0–5.0)
HCT: 40.3 % (ref 36.0–46.0)
Hemoglobin: 13.7 g/dL (ref 12.0–15.0)
Lymphocytes Relative: 21.7 % (ref 12.0–46.0)
Lymphs Abs: 1.3 10*3/uL (ref 0.7–4.0)
MCHC: 33.8 g/dL (ref 30.0–36.0)
MCV: 95 fl (ref 78.0–100.0)
Monocytes Absolute: 0.5 10*3/uL (ref 0.1–1.0)
Monocytes Relative: 7.7 % (ref 3.0–12.0)
Neutro Abs: 4.1 10*3/uL (ref 1.4–7.7)
Neutrophils Relative %: 66.3 % (ref 43.0–77.0)
Platelets: 227 10*3/uL (ref 150.0–400.0)
RBC: 4.25 Mil/uL (ref 3.87–5.11)
RDW: 15.2 % (ref 11.5–15.5)
WBC: 6.1 10*3/uL (ref 4.0–10.5)

## 2022-10-03 LAB — TSH: TSH: 1.44 u[IU]/mL (ref 0.35–5.50)

## 2022-10-03 LAB — LIPID PANEL
Cholesterol: 247 mg/dL — ABNORMAL HIGH (ref 0–200)
HDL: 43.8 mg/dL (ref >39.00)
LDL Cholesterol: 167 mg/dL — ABNORMAL HIGH (ref 0–99)
NonHDL: 203.39
Total CHOL/HDL Ratio: 6
Triglycerides: 180 mg/dL — ABNORMAL HIGH (ref 0.0–149.0)
VLDL: 36 mg/dL (ref 0.0–40.0)

## 2022-10-03 LAB — COMPREHENSIVE METABOLIC PANEL
ALT: 20 U/L (ref 0–35)
AST: 30 U/L (ref 0–37)
Albumin: 3.9 g/dL (ref 3.5–5.2)
Alkaline Phosphatase: 75 U/L (ref 39–117)
BUN: 8 mg/dL (ref 6–23)
CO2: 29 mEq/L (ref 19–32)
Calcium: 9.1 mg/dL (ref 8.4–10.5)
Chloride: 106 mEq/L (ref 96–112)
Creatinine, Ser: 0.88 mg/dL (ref 0.40–1.20)
GFR: 72.71 mL/min (ref >60.00)
Glucose, Bld: 103 mg/dL — ABNORMAL HIGH (ref 70–99)
Potassium: 4.5 mEq/L (ref 3.5–5.1)
Sodium: 142 mEq/L (ref 135–145)
Total Bilirubin: 0.5 mg/dL (ref 0.2–1.2)
Total Protein: 7 g/dL (ref 6.0–8.3)

## 2022-10-03 LAB — HEMOGLOBIN A1C: Hgb A1c MFr Bld: 5.9 % (ref 4.6–6.5)

## 2022-10-03 MED ORDER — METOPROLOL SUCCINATE ER 50 MG PO TB24
50.0000 mg | ORAL_TABLET | Freq: Every day | ORAL | 3 refills | Status: DC
Start: 2022-10-03 — End: 2023-09-08

## 2022-10-03 MED ORDER — ROSUVASTATIN CALCIUM 10 MG PO TABS: 10.0000 mg | ORAL_TABLET | Freq: Every day | ORAL | 3 refills | Status: DC

## 2022-10-03 NOTE — Progress Notes (Signed)
Subjective  Chief Complaint  Patient presents with   Annual Exam    Pt here for Annual Exam and is currently fasting    Hypertension    HPI: Monique Carney is a 57 y.o. female who presents to [facility] [place] today for a Female Wellness Visit. She also has the concerns and/or needs as listed above in the chief complaint. These will be addressed in addition to the Health Maintenance Visit.   Wellness Visit: annual visit with health maintenance review and exam without Pap  HM: pap and mammo are current. Last colonoscopy 2016: hyperplastic polyps with Dr. Loreta Ave. Due eye exam. Imms utd. Has worked on improving diet; some intermittent fasting and weight is down about 10 pounds. Feels better. No mood concerns. Chronic disease f/u and/or acute problem visit: (deemed necessary to be done in addition to the wellness visit): Rheumatoid arthritis managed by rheumatology.  Coming down on her dosing of methotrexate.  Mild flares.  But overall well-controlled Reviewed reports from ultrasound-guided thyroid nodule biopsy showing follicular nodule, benign.  No further evaluation needed. Hypertension: Feels well without chest pain or shortness of breath.  Taking Toprol-XL 25 daily and hydrochlorothiazide 25 daily.  No edema.  No exertional symptoms.  No palpitations. Hyperlipidemia: I recommended Crestor 10 back in September, however patient never started this.  More of an oversight than anything.  She is willing to try.  Diet has improved.  She is fasting for recheck today.  She does have multiple cardiovascular risk factors. Prediabetes: Will recheck A1c today.  No symptoms of hypoglycemia and diet has improved. History of GERD: Completed a short course of omeprazole.  Symptoms have completely resolved.  Weight loss has also been beneficial.  Assessment  1. Annual physical exam   2. Thyroid nodule benign follicular by biopsy 04/2022   3. Essential hypertension   4. Mixed  hyperlipidemia   5. Morbid obesity   6. Rheumatoid arthritis involving multiple sites with positive rheumatoid factor (HCC), on MTX   7. Prediabetes   8. Hyperplastic colonic polyp, unspecified part of colon   9. Gastroesophageal reflux disease without esophagitis      Plan  Female Wellness Visit: Age appropriate Health Maintenance and Prevention measures were discussed with patient. Included topics are cancer screening recommendations, ways to keep healthy (see AVS) including dietary and exercise recommendations, regular eye and dental care, use of seat belts, and avoidance of moderate alcohol use and tobacco use.  Screens are current BMI: discussed patient's BMI and encouraged positive lifestyle modifications to help get to or maintain a target BMI. HM needs and immunizations were addressed and ordered. See below for orders. See HM and immunization section for updates. Routine labs and screening tests ordered including cmp, cbc and lipids where appropriate. Discussed recommendations regarding Vit D and calcium supplementation (see AVS)  Chronic disease management visit and/or acute problem visit: RA is controlled Hypertension: Fairly well-controlled.  Will push diastolics lower.  Increase Toprol-XL to 50 mg daily continue hydrochlorothiazide 25 mg daily.  Recheck renal function and electrolytes Hyperlipidemia: Discussed risk factors for cardiovascular disease.  Start Crestor 10 mg nightly.  Monitor for myalgia.  Low fat diet recommended.  Recheck fasting lipids today.  Check LFTs Obesity: Improving.  Continue healthier diet Benign thyroid nodule.  No further workup recommended. Prediabetes: Education on low sugar diet.  Recheck A1c.  Continue weight loss. History of GERD.  Now resolved  Follow up: 6 months for blood pressure recheck Orders  Placed This Encounter  Procedures   CBC with Differential/Platelet   Comprehensive metabolic panel   Hemoglobin A1c   Lipid panel   TSH   Meds  ordered this encounter  Medications   metoprolol succinate (TOPROL-XL) 50 MG 24 hr tablet    Sig: Take 1 tablet (50 mg total) by mouth daily.    Dispense:  90 tablet    Refill:  3   rosuvastatin (CRESTOR) 10 MG tablet    Sig: Take 1 tablet (10 mg total) by mouth daily.    Dispense:  90 tablet    Refill:  3      Body mass index is 39.68 kg/m. Wt Readings from Last 3 Encounters:  10/03/22 224 lb (101.6 kg)  03/04/22 237 lb 3.2 oz (107.6 kg)  02/28/22 236 lb (107 kg)     Patient Active Problem List   Diagnosis Date Noted   Mixed hyperlipidemia 03/11/2022    Priority: High    Started crestor 02/2022    Prediabetes 03/11/2022    Priority: High    a1c 6.0 02/2022    Essential hypertension 01/16/2017    Priority: High   Morbid obesity 01/16/2017    Priority: High   Hyperplastic colonic polyp 03/16/2021    Priority: Medium    Family history of premature CAD 09/08/2019    Priority: Medium    Rheumatoid arthritis involving multiple sites with positive rheumatoid factor (HCC), on MTX 03/08/2017    Priority: Medium     Followed by Dr. Kathi Ludwig Rheumatoid arthritis-CCP+, RF+, ANA 1:160, Difuse Synovitis of hands, feet, wrist, non erosive.     Thyroid nodule benign follicular by biopsy 04/2022 03/01/2022    Priority: Low    2.3 cm noted on CTA 02/2022; [place] and the US guided biopsy 04/2022.     Pulmonary nodules 03/01/2022    Priority: Low    Multiple < 39mm on CTA 02/2022. No further imaging recommended.     Gastroesophageal reflux disease without esophagitis 10/03/2022   Health Maintenance  Topic Date Due   COVID-19 Vaccine (4 - 2023-24 season) 10/19/2022 (Originally 02/22/2022)   INFLUENZA VACCINE  01/23/2023   MAMMOGRAM  05/02/2023   COLONOSCOPY (Pts 45-6yrs Insurance coverage will need to be confirmed)  01/05/2025   PAP SMEAR-Modifier  09/08/2025   DTaP/Tdap/Td (2 - Td or Tdap) 06/02/2030   Hepatitis C Screening  Completed   HPV VACCINES  Aged Out   HIV Screening   Discontinued   Zoster Vaccines- Shingrix  Discontinued   Immunization History  Administered Date(s) Administered   Influenza, Quadrivalent, Recombinant, Inj, Pf 03/26/2018, 05/06/2019   Influenza,inj,Quad PF,6+ Mos 06/02/2020, 03/16/2021, 03/04/2022   Influenza-Unspecified 04/07/2013   PFIZER(Purple Top)SARS-COV-2 Vaccination 09/02/2019, 09/27/2019, 04/14/2020   Tdap 06/02/2020   Tetanus 06/24/2002   Zoster Recombinat (Shingrix) 09/08/2020   We updated and reviewed the patient's past history in detail and it is documented below. Allergies: Patient is allergic to shingrix [zoster vac recomb adjuvanted]. Past Medical History Patient  has a past medical history of Bartholin's gland abscess, right, Condyloma acuminatum of perianal region, Hypercholesteremia, Hypertension, Meningitis spinal, Prediabetes (03/11/2022), and Rheumatoid arthritis. Past Surgical History Patient  has a past surgical history that includes Cataract extraction (Right); perirectal biopsy (02-19-05); and Tooth extraction. Family History: Patient family history includes CAD (age of onset: 41) in her father; Hypertension in her brother. Social History:  Patient  reports that she has quit smoking. Her smoking use included cigarettes. She has never used smokeless tobacco. She reports current  alcohol use of about 5.0 standard drinks of alcohol per week. She reports that she does not use drugs.  Review of Systems: Constitutional: negative for fever or malaise Ophthalmic: negative for photophobia, double vision or loss of vision Cardiovascular: negative for chest pain, dyspnea on exertion, or new LE swelling Respiratory: negative for SOB or persistent cough Gastrointestinal: negative for abdominal pain, change in bowel habits or melena Genitourinary: negative for dysuria or gross hematuria, no abnormal uterine bleeding or disharge Musculoskeletal: negative for new gait disturbance or muscular weakness Integumentary: negative  for new or persistent rashes, no breast lumps Neurological: negative for TIA or stroke symptoms Psychiatric: negative for SI or delusions Allergic/Immunologic: negative for hives  Patient Care Team    Relationship Specialty Notifications Start End  Willow OraAndy, Olof Marcil L, MD PCP - General Family Medicine  09/08/19   Rossie MuskratSyed, Tauseef G, MD  Rheumatology  07/10/18   Charna ElizabethMann, Jyothi, MD Consulting Physician Gastroenterology  03/16/21     Objective  Vitals: BP 110/84   Pulse 80   Temp 97.8 F (36.6 C)   Ht 5\' 3"  (1.6 m)   Wt 224 lb (101.6 kg)   LMP 04/25/2011   SpO2 94%   BMI 39.68 kg/m  General:  Well developed, well nourished, no acute distress  Psych:  Alert and orientedx3,normal mood and affect HEENT:  Normocephalic, atraumatic, non-icteric sclera,  supple neck without adenopathy, mass or thyromegaly Cardiovascular:  Normal S1, S2, RRR without gallop, rub or murmur Respiratory:  Good breath sounds bilaterally, CTAB with normal respiratory effort Gastrointestinal: normal bowel sounds, soft, non-tender, no noted masses. No HSM MSK: extremities without edema, joints without erythema or swelling, some RA changes present in feet Neurologic:    Mental status is normal.  Gross motor and sensory exams are normal.  No tremor  Commons side effects, risks, benefits, and alternatives for medications and treatment plan prescribed today were discussed, and the patient expressed understanding of the given instructions. Patient is instructed to call or message via MyChart if he/she has any questions or concerns regarding our treatment plan. No barriers to understanding were identified. We discussed Red Flag symptoms and signs in detail. Patient expressed understanding regarding what to do in case of urgent or emergency type symptoms.  Medication list was reconciled, printed and provided to the patient in AVS. Patient instructions and summary information was reviewed with the patient as documented in the AVS. This  note was prepared with assistance of Dragon voice recognition software. Occasional wrong-word or sound-a-like substitutions may have occurred due to the inherent limitations of voice recognition software

## 2022-10-03 NOTE — Patient Instructions (Signed)
Please return in 6 months for hypertension follow up.   I have ordered the larger dose of the metoprolol; please start the 50mg  daily to help manage your blood pressure. Start the rosuvastatin to lower your cholesterol and eat a low carb/low sweet diet.   Excellent job with your weight loss! Keep it up.  Wt Readings from Last 3 Encounters:  10/03/22 224 lb (101.6 kg)  03/04/22 237 lb 3.2 oz (107.6 kg)  02/28/22 236 lb (107 kg)  We will call you with your lab results.   If you have any questions or concerns, please don't hesitate to send me a message via MyChart or call the office at [phone]. Thank you for visiting with [place] today! It's our pleasure caring for you.

## 2022-10-30 DIAGNOSIS — E041 Nontoxic single thyroid nodule: Secondary | ICD-10-CM | POA: Diagnosis not present

## 2022-11-04 DIAGNOSIS — M1990 Unspecified osteoarthritis, unspecified site: Secondary | ICD-10-CM | POA: Diagnosis not present

## 2022-11-04 DIAGNOSIS — M0589 Other rheumatoid arthritis with rheumatoid factor of multiple sites: Secondary | ICD-10-CM | POA: Diagnosis not present

## 2022-11-04 DIAGNOSIS — Z79899 Other long term (current) drug therapy: Secondary | ICD-10-CM | POA: Diagnosis not present

## 2022-12-18 ENCOUNTER — Other Ambulatory Visit: Payer: Self-pay | Admitting: Family Medicine

## 2023-03-03 ENCOUNTER — Other Ambulatory Visit: Payer: Self-pay | Admitting: Nurse Practitioner

## 2023-03-03 DIAGNOSIS — E041 Nontoxic single thyroid nodule: Secondary | ICD-10-CM

## 2023-03-11 ENCOUNTER — Ambulatory Visit
Admission: RE | Admit: 2023-03-11 | Discharge: 2023-03-11 | Disposition: A | Discharge status: Home / Self Care | Payer: BC Managed Care – PPO | Source: Ambulatory Visit | Attending: Nurse Practitioner | Admitting: Nurse Practitioner

## 2023-03-11 DIAGNOSIS — E042 Nontoxic multinodular goiter: Secondary | ICD-10-CM | POA: Diagnosis not present

## 2023-03-11 DIAGNOSIS — E041 Nontoxic single thyroid nodule: Secondary | ICD-10-CM

## 2023-03-26 DIAGNOSIS — E041 Nontoxic single thyroid nodule: Secondary | ICD-10-CM | POA: Diagnosis not present

## 2023-03-26 DIAGNOSIS — E785 Hyperlipidemia, unspecified: Secondary | ICD-10-CM | POA: Diagnosis not present

## 2023-04-04 ENCOUNTER — Ambulatory Visit: Payer: BC Managed Care – PPO | Admitting: Family Medicine

## 2023-04-07 ENCOUNTER — Ambulatory Visit: Payer: BC Managed Care – PPO | Admitting: Family Medicine

## 2023-04-07 ENCOUNTER — Encounter: Payer: Self-pay | Admitting: Family Medicine

## 2023-04-07 VITALS — BP 100/78 | HR 78 | Temp 97.6°F | Ht 63.0 in | Wt 229.4 lb

## 2023-04-07 DIAGNOSIS — E782 Mixed hyperlipidemia: Secondary | ICD-10-CM

## 2023-04-07 DIAGNOSIS — I10 Essential (primary) hypertension: Secondary | ICD-10-CM | POA: Diagnosis not present

## 2023-04-07 DIAGNOSIS — R7303 Prediabetes: Secondary | ICD-10-CM | POA: Diagnosis not present

## 2023-04-07 LAB — POCT GLYCOSYLATED HEMOGLOBIN (HGB A1C): Hemoglobin A1C: 5.4 % (ref 4.0–5.6)

## 2023-04-07 NOTE — Progress Notes (Signed)
Subjective  CC:  Chief Complaint  Patient presents with   Hyperlipidemia   Hypertension   Prediabetes    HPI: Monique Carney is a 57 y.o. female who presents to the office today to address the problems listed above in the chief complaint. HTN: very well controlled. Consistently normal at visits. Tolerating meds.  Prediabetes: did well over summer with diet and weight loss (was living with mother, caretaking). Now working on things again. No sxs of hyperglycemia HLD: just started the crestor 2 weeks ago. Tolerating well.  HM: due mammo in November. Declines flu vaccine today. Retired. Very happy   Assessment  1. Essential hypertension   2. Morbid obesity (HCC)   3. Mixed hyperlipidemia   4. Prediabetes      Plan  HTN:  controlled. Working on weight loss Prediabetes: A1c  HLD: on crestor now. Discussed indications and benefits. Check lipids at next cpe.   Follow up: 6 mo for cpe Visit date not found  Orders Placed This Encounter  Procedures   POCT glycosylated hemoglobin (Hb A1C)   No orders of the defined types were placed in this encounter.     I reviewed the patients updated PMH, FH, and SocHx.    Patient Active Problem List   Diagnosis Date Noted   Mixed hyperlipidemia 03/11/2022    Priority: High   Prediabetes 03/11/2022    Priority: High   Essential hypertension 01/16/2017    Priority: High   Morbid obesity (HCC) 01/16/2017    Priority: High   Gastroesophageal reflux disease without esophagitis 10/03/2022    Priority: Medium    Hyperplastic colonic polyp 03/16/2021    Priority: Medium    Family history of premature CAD 09/08/2019    Priority: Medium    Rheumatoid arthritis involving multiple sites with positive rheumatoid factor (HCC), on MTX 03/08/2017    Priority: Medium    Thyroid nodule benign follicular by biopsy 04/2022 03/01/2022    Priority: Low   Pulmonary nodules 03/01/2022    Priority: Low   Current Meds  Medication Sig   folic acid  (FOLVITE) 1 MG tablet Take 1 mg by mouth daily.   hydrochlorothiazide (HYDRODIURIL) 25 MG tablet TAKE 1 TABLET BY MOUTH EVERY DAY   methotrexate (RHEUMATREX) 2.5 MG tablet TK 9 TS PO 1 TIME A WEEK   metoprolol succinate (TOPROL-XL) 50 MG 24 hr tablet Take 1 tablet (50 mg total) by mouth daily.   rosuvastatin (CRESTOR) 10 MG tablet Take 1 tablet (10 mg total) by mouth daily.    Allergies: Patient is allergic to shingrix [zoster vac recomb adjuvanted]. Family History: Patient family history includes CAD (age of onset: 29) in her father; Hypertension in her brother. Social History:  Patient  reports that she has quit smoking. Her smoking use included cigarettes. She has never used smokeless tobacco. She reports current alcohol use of about 5.0 standard drinks of alcohol per week. She reports that she does not use drugs.  Review of Systems: Constitutional: Negative for fever malaise or anorexia Cardiovascular: negative for chest pain Respiratory: negative for SOB or persistent cough Gastrointestinal: negative for abdominal pain  Objective  Vitals: BP 100/78   Pulse 78   Temp 97.6 F (36.4 C)   Ht 5\' 3"  (1.6 m)   Wt 229 lb 6.4 oz (104.1 kg)   LMP 04/25/2011   SpO2 98%   BMI 40.64 kg/m  General: no acute distress , A&Ox3  Lab Results  Component Value Date   HGBA1C  5.4 04/07/2023   HGBA1C 5.9 10/03/2022   HGBA1C 6.0 03/04/2022    Lab Results  Component Value Date   CHOL 247 (H) 10/03/2022   HDL 43.80 10/03/2022   LDLCALC 167 (H) 10/03/2022   LDLDIRECT 141.0 09/08/2020   TRIG 180.0 (H) 10/03/2022   CHOLHDL 6 10/03/2022     Commons side effects, risks, benefits, and alternatives for medications and treatment plan prescribed today were discussed, and the patient expressed understanding of the given instructions. Patient is instructed to call or message via MyChart if he/she has any questions or concerns regarding our treatment plan. No barriers to understanding were  identified. We discussed Red Flag symptoms and signs in detail. Patient expressed understanding regarding what to do in case of urgent or emergency type symptoms.  Medication list was reconciled, printed and provided to the patient in AVS. Patient instructions and summary information was reviewed with the patient as documented in the AVS. This note was prepared with assistance of Dragon voice recognition software. Occasional wrong-word or sound-a-like substitutions may have occurred due to the inherent limitations of voice recognition software

## 2023-04-07 NOTE — Patient Instructions (Signed)
Please return in 6 months for your annual complete physical; please come fasting.    Your sugar test has improved.  Continue taking the crestor  If you have any questions or concerns, please don't hesitate to send me a message via MyChart or call the office at [phone]. Thank you for visiting with Monique Carney today! It's our pleasure caring for you.

## 2023-04-08 DIAGNOSIS — M0589 Other rheumatoid arthritis with rheumatoid factor of multiple sites: Secondary | ICD-10-CM | POA: Diagnosis not present

## 2023-04-08 DIAGNOSIS — Z79899 Other long term (current) drug therapy: Secondary | ICD-10-CM | POA: Diagnosis not present

## 2023-04-19 ENCOUNTER — Other Ambulatory Visit: Payer: Self-pay | Admitting: Family Medicine

## 2023-05-07 ENCOUNTER — Encounter: Payer: Self-pay | Admitting: Family Medicine

## 2023-05-07 DIAGNOSIS — Z1231 Encounter for screening mammogram for malignant neoplasm of breast: Secondary | ICD-10-CM | POA: Diagnosis not present

## 2023-05-07 LAB — HM MAMMOGRAPHY

## 2023-05-19 ENCOUNTER — Other Ambulatory Visit: Payer: Self-pay | Admitting: Family Medicine

## 2023-07-01 DIAGNOSIS — M7130 Other bursal cyst, unspecified site: Secondary | ICD-10-CM | POA: Diagnosis not present

## 2023-07-01 DIAGNOSIS — M0589 Other rheumatoid arthritis with rheumatoid factor of multiple sites: Secondary | ICD-10-CM | POA: Diagnosis not present

## 2023-07-01 DIAGNOSIS — Z79899 Other long term (current) drug therapy: Secondary | ICD-10-CM | POA: Diagnosis not present

## 2023-07-01 DIAGNOSIS — M1990 Unspecified osteoarthritis, unspecified site: Secondary | ICD-10-CM | POA: Diagnosis not present

## 2023-07-25 DIAGNOSIS — Z961 Presence of intraocular lens: Secondary | ICD-10-CM | POA: Diagnosis not present

## 2023-07-25 DIAGNOSIS — H2512 Age-related nuclear cataract, left eye: Secondary | ICD-10-CM | POA: Diagnosis not present

## 2023-07-25 DIAGNOSIS — H40013 Open angle with borderline findings, low risk, bilateral: Secondary | ICD-10-CM | POA: Diagnosis not present

## 2023-07-25 DIAGNOSIS — H04123 Dry eye syndrome of bilateral lacrimal glands: Secondary | ICD-10-CM | POA: Diagnosis not present

## 2023-07-25 DIAGNOSIS — H524 Presbyopia: Secondary | ICD-10-CM | POA: Diagnosis not present

## 2023-09-08 ENCOUNTER — Encounter (INDEPENDENT_AMBULATORY_CARE_PROVIDER_SITE_OTHER): Payer: Self-pay | Admitting: Physician Assistant

## 2023-09-08 ENCOUNTER — Ambulatory Visit (INDEPENDENT_AMBULATORY_CARE_PROVIDER_SITE_OTHER): Admitting: Physician Assistant

## 2023-09-08 VITALS — BP 122/81 | HR 60 | Temp 98.0°F | Ht 62.0 in | Wt 230.0 lb

## 2023-09-08 DIAGNOSIS — K219 Gastro-esophageal reflux disease without esophagitis: Secondary | ICD-10-CM

## 2023-09-08 DIAGNOSIS — K635 Polyp of colon: Secondary | ICD-10-CM | POA: Diagnosis not present

## 2023-09-08 DIAGNOSIS — R7303 Prediabetes: Secondary | ICD-10-CM | POA: Diagnosis not present

## 2023-09-08 DIAGNOSIS — E782 Mixed hyperlipidemia: Secondary | ICD-10-CM

## 2023-09-08 DIAGNOSIS — I10 Essential (primary) hypertension: Secondary | ICD-10-CM

## 2023-09-08 DIAGNOSIS — Z6841 Body Mass Index (BMI) 40.0 and over, adult: Secondary | ICD-10-CM

## 2023-09-08 DIAGNOSIS — Z0289 Encounter for other administrative examinations: Secondary | ICD-10-CM

## 2023-09-08 DIAGNOSIS — M0579 Rheumatoid arthritis with rheumatoid factor of multiple sites without organ or systems involvement: Secondary | ICD-10-CM

## 2023-09-08 DIAGNOSIS — E66813 Obesity, class 3: Secondary | ICD-10-CM

## 2023-09-08 DIAGNOSIS — E041 Nontoxic single thyroid nodule: Secondary | ICD-10-CM

## 2023-09-08 NOTE — Progress Notes (Signed)
 Office: [phone]  /  Fax: [phone]   Initial Visit  Monique Carney was seen in clinic today to evaluate for obesity. She is interested in losing weight to improve overall health and reduce the risk of weight related complications. She presents today to review program treatment options, initial physical assessment, and evaluation.     She was referred by: Friend or Family  When asked what else they would like to accomplish? She states: Adopt healthier eating patterns, Improve energy levels and physical activity, Improve existing medical conditions, Reduce number of medications, Improve quality of life, Improve appearance, and Improve self-confidence  When asked how has your weight affected you? She states: Contributed to medical problems, Having fatigue, Having poor endurance, Problems with eating patterns, and Has affected mood   Weight history: Around 140 lbs most of adult life.   Quit smoking and started gaining weight. Then developed RA and had to be on steroids and then injections for RA- Humira at age 46 and had gained to 210 lbs.   Some associated conditions: Hypertension, Arthritis:RA, Hyperlipidemia, Prediabetes, and Connective tissue disease: RA  Contributing factors: Consumption of processed foods, Use of obesogenic medications: Beta-blockers, Eating patterns, Menopause, Strong orexigenic signaling and inadequate inhibitory control , Slow metabolism for age, and Enticing relationships and enviroment  Weight promoting medications identified: Beta-blockers  Current nutrition plan: None  Current level of physical activity: None and Walking 45 minutes  Current or previous pharmacotherapy: Phentermine  Response to medication: Lost weight initially but was unable to sustain weight loss   Past medical history includes:   Past Medical History:  Diagnosis Date   Bartholin's gland abscess, right    Condyloma acuminatum of perianal region    Hypercholesteremia     Hypertension    Meningitis spinal    age 2   Prediabetes 03/11/2022   a1c 6.0 02/2022   Rheumatoid arthritis (HCC)      Objective:   BP 122/81   Pulse 60   Temp 98 F (36.7 C)   Ht 5\' 2"  (1.575 m)   Wt 230 lb (104.3 kg)   LMP 04/25/2011   SpO2 97%   BMI 42.07 kg/m  She was weighed on the bioimpedance scale: Body mass index is 42.07 kg/m.  Peak Weight:238 lb , Body Fat%:42.2%, Visceral Fat Rating:16, Weight trend over the last 12 months: Increasing  General:  Alert, oriented and cooperative. Patient is in no acute distress.  Respiratory: Normal respiratory effort, no problems with respiration noted   Gait: able to ambulate independently  Mental Status: Normal mood and affect. Normal behavior. Normal judgment and thought content.   DIAGNOSTIC DATA REVIEWED:  BMET    Component Value Date/Time   NA 142 10/03/2022 0954   NA 140 11/05/2018 0000   K 4.5 10/03/2022 0954   CL 106 10/03/2022 0954   CO2 29 10/03/2022 0954   GLUCOSE 103 (H) 10/03/2022 0954   BUN 8 10/03/2022 0954   BUN 12 11/05/2018 0000   CREATININE 0.88 10/03/2022 0954   CREATININE 0.81 02/16/2020 1359   CALCIUM 9.1 10/03/2022 0954   GFRNONAA >60 02/28/2022 1139   Lab Results  Component Value Date   HGBA1C 5.4 04/07/2023   HGBA1C 5.6 01/16/2017   No results found for: "INSULIN" CBC    Component Value Date/Time   WBC 6.1 10/03/2022 0954   RBC 4.25 10/03/2022 0954   HGB 13.7 10/03/2022 0954   HCT 40.3 10/03/2022 0954   PLT 227.0 10/03/2022 0954   MCV  95.0 10/03/2022 0954   MCH 32.0 02/28/2022 1139   MCHC 33.8 10/03/2022 0954   RDW 15.2 10/03/2022 0954   Iron/TIBC/Ferritin/ %Sat No results found for: "IRON", "TIBC", "FERRITIN", "IRONPCTSAT" Lipid Panel     Component Value Date/Time   CHOL 247 (H) 10/03/2022 0954   TRIG 180.0 (H) 10/03/2022 0954   HDL 43.80 10/03/2022 0954   CHOLHDL 6 10/03/2022 0954   VLDL 36.0 10/03/2022 0954   LDLCALC 167 (H) 10/03/2022 0954   LDLDIRECT 141.0 09/08/2020  1429   Hepatic Function Panel     Component Value Date/Time   PROT 7.0 10/03/2022 0954   ALBUMIN 3.9 10/03/2022 0954   AST 30 10/03/2022 0954   ALT 20 10/03/2022 0954   ALKPHOS 75 10/03/2022 0954   BILITOT 0.5 10/03/2022 0954      Component Value Date/Time   TSH 1.44 10/03/2022 0954     Assessment and Plan:   Prediabetes  Essential hypertension  Mixed hyperlipidemia  Hyperplastic colonic polyp, unspecified part of colon  Rheumatoid arthritis involving multiple sites with positive rheumatoid factor (HCC), on MTX  Gastroesophageal reflux disease without esophagitis  Thyroid nodule greater than or equal to 1.5 cm in diameter incidentally noted on imaging study  Class 3 severe obesity due to excess calories with serious comorbidity and body mass index (BMI) of 40.0 to 44.9 in adult [facility])  Current BMI 42.2      Obesity Treatment / Action Plan:  Patient will work on garnering support from family and friends to begin weight loss journey. Will work on eliminating or reducing the presence of highly palatable, calorie dense foods in the home. Will complete provided nutritional and psychosocial assessment questionnaire before the next appointment. Will be scheduled for indirect calorimetry to determine resting energy expenditure in a fasting state.  This will allow [place] to create a reduced calorie, high-protein meal plan to promote loss of fat mass while preserving muscle mass. Will think about ideas on how to incorporate physical activity into their daily routine. Will work on reducing intake of added sugars, simple sugars and processed carbs. Will avoid skipping meals which may result in increased hunger signals and overeating at certain times. Will reduce the frequency of eating out and making healthier choices by advanced menu planning. Counseled on the health benefits of losing 5%-15% of total body weight. Will work on improving sleep hygiene and trying to obtain at least 7  hours of sleep. Was counseled on nutritional approaches to weight loss and benefits of reducing processed foods and consuming plant-based foods and high quality protein as part of nutritional weight management. Was counseled on pharmacotherapy and role as an adjunct in weight management.   Obesity Education Performed Today:  She was weighed on the bioimpedance scale and results were discussed and documented in the synopsis.  We discussed obesity as a disease and the importance of a more detailed evaluation of all the factors contributing to the disease.  We discussed the importance of long term lifestyle changes which include nutrition, exercise and behavioral modifications as well as the importance of customizing this to her specific health and social needs.  We discussed the benefits of reaching a healthier weight to alleviate the symptoms of existing conditions and reduce the risks of the biomechanical, metabolic and psychological effects of obesity.  Monique Carney appears to be in the action stage of change and states they are ready to start intensive lifestyle modifications and behavioral modifications.  30 minutes was spent today on this  visit including the above counseling, pre-visit chart review, and post-visit documentation.  Reviewed by clinician on day of visit: allergies, medications, problem list, medical history, surgical history, family history, social history, and previous encounter notes pertinent to obesity diagnosis.   Elias Bordner,PA-C

## 2023-10-02 DIAGNOSIS — Z79899 Other long term (current) drug therapy: Secondary | ICD-10-CM | POA: Diagnosis not present

## 2023-10-02 DIAGNOSIS — M0589 Other rheumatoid arthritis with rheumatoid factor of multiple sites: Secondary | ICD-10-CM | POA: Diagnosis not present

## 2023-10-02 DIAGNOSIS — M79671 Pain in right foot: Secondary | ICD-10-CM | POA: Diagnosis not present

## 2023-10-02 DIAGNOSIS — M1990 Unspecified osteoarthritis, unspecified site: Secondary | ICD-10-CM | POA: Diagnosis not present

## 2023-10-06 ENCOUNTER — Ambulatory Visit (INDEPENDENT_AMBULATORY_CARE_PROVIDER_SITE_OTHER): Admitting: Family Medicine

## 2023-10-06 ENCOUNTER — Encounter (INDEPENDENT_AMBULATORY_CARE_PROVIDER_SITE_OTHER): Payer: Self-pay | Admitting: Family Medicine

## 2023-10-06 VITALS — BP 125/77 | HR 78 | Temp 98.3°F | Ht 62.0 in | Wt 232.0 lb

## 2023-10-06 DIAGNOSIS — E559 Vitamin D deficiency, unspecified: Secondary | ICD-10-CM | POA: Insufficient documentation

## 2023-10-06 DIAGNOSIS — Z6841 Body Mass Index (BMI) 40.0 and over, adult: Secondary | ICD-10-CM

## 2023-10-06 DIAGNOSIS — M0579 Rheumatoid arthritis with rheumatoid factor of multiple sites without organ or systems involvement: Secondary | ICD-10-CM | POA: Diagnosis not present

## 2023-10-06 DIAGNOSIS — R7303 Prediabetes: Secondary | ICD-10-CM

## 2023-10-06 DIAGNOSIS — E782 Mixed hyperlipidemia: Secondary | ICD-10-CM | POA: Diagnosis not present

## 2023-10-06 DIAGNOSIS — E041 Nontoxic single thyroid nodule: Secondary | ICD-10-CM

## 2023-10-06 DIAGNOSIS — E669 Obesity, unspecified: Secondary | ICD-10-CM

## 2023-10-06 DIAGNOSIS — R5383 Other fatigue: Secondary | ICD-10-CM | POA: Diagnosis not present

## 2023-10-06 DIAGNOSIS — R0602 Shortness of breath: Secondary | ICD-10-CM

## 2023-10-06 DIAGNOSIS — I10 Essential (primary) hypertension: Secondary | ICD-10-CM | POA: Diagnosis not present

## 2023-10-06 NOTE — Assessment & Plan Note (Signed)
 Blood pressure well controlled today.  She is on medication of metoprolol and hydrochlorothiazide.  She has only been on these medications and has been controlled without needing other medication trials.  No chest pain, chest pressure of headache.  CMP today.

## 2023-10-06 NOTE — Assessment & Plan Note (Signed)
 Patient on rosuvastatin daily.  No transaminitis and no myalgias (that aren't attributed to RA). FLP today.

## 2023-10-06 NOTE — Assessment & Plan Note (Signed)
 Sees NP Delfin Fell in endocrine and is followed yearly.  Will order thyroid panel today.

## 2023-10-06 NOTE — Assessment & Plan Note (Signed)
 Not in Vitamin D but historically has been low.  Repeat Vitamin D level today.

## 2023-10-06 NOTE — Assessment & Plan Note (Signed)
 Last A1c in October better at 5.4.  Prior to that was 5.9.  She enjoys simple carbohydrates but tends to lose control when consuming them.

## 2023-10-06 NOTE — Assessment & Plan Note (Signed)
 Followed by Dr. Henrine Logan Rheumatoid arthritis-CCP+, RF+, ANA 1:160, Difuse Synovitis of hands, feet, wrist, non erosive.    She is stable on methotrexate without many side effects.  She was just encouraged to go back up to the 9 a week instead of 8 a week.  Needs a B12 and Folate level today.

## 2023-10-06 NOTE — Progress Notes (Signed)
 Chief Complaint:  Obesity   Subjective:  Monique Carney (MR# [id number]) is a 58 y.o. female who presents for evaluation and treatment of obesity and related comorbidities.   Monique Carney is currently in the action stage of change and ready to dedicate time achieving and maintaining a healthier weight. Monique Carney is interested in becoming our patient and working on intensive lifestyle modifications including (but not limited to) diet and exercise for weight loss.  Monique Carney has been struggling with her weight. She has been unsuccessful in either losing weight, maintaining weight loss, or reaching her healthy weight goal.  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  She lives at home with Monique Carney her husband who is supportive of her, eats meals with her and will be eating healthier with her. Goal weight is 140 which is where she stayed for most time of her adult life.  Stopped smoking and got up to 160.  Around 58 years old she experienced another significant gain.  At 58 years old she broke 200lbs and weighed around 210lbs.  Has tried a few previous dietary plans like [employer] [facility]- loss 30lbs and did not keep it off, Atkins, [employer], Intermittent Fasting, Phentermine .  Thinks she did the best on atkins with phentermine .  Skipping dinner 3 times a week because she is eating later in the day.   Food Recall: Coffee in the am.  Around 10:30/11 will have 2 eggs except Wednesday- feels satisfied, may have 3 strips bacon.  Lunch special beef with rice, chicken wings and a fresh roll- half portion.  Feels full.  Eats leftovers later in the day with her husband around 6pm. Feels full from that. May have ice cream after that- 1 cup- feels satisfied.  She is emotionally eating frequently.  Indirect Calorimeter completed today shows a RMR: 1541 .Her calculated basal metabolic rate is 2841 thus her basal metabolic rate is better than expected.  Other Fatigue Trenita admits to daytime somnolence and admits to  waking up still tired. Patient has a history of symptoms of daytime fatigue and hypertension. Toni generally gets 7 hours of sleep per night, and states that she has difficulty falling asleep and difficulty falling back asleep if awakened. Snoring is present. Apneic episodes are not present. Epworth Sleepiness Score is 7.   Shortness of Breath Kaula notes increasing shortness of breath with exercising and seems to be worsening over time with weight gain. She notes getting out of breath sooner with activity than she used to. This has not gotten worse recently. Sula denies shortness of breath at rest or orthopnea.  Depression Screen Di's Food and Mood (modified PHQ-9) score was 14.     04/07/2023    9:38 AM  Depression screen PHQ 2/9  Decreased Interest 0  Down, Depressed, Hopeless 0  PHQ - 2 Score 0     Objective:  Vitals Temp: 98.3 F (36.8 C) BP: 125/77 Pulse Rate: 78 SpO2: 99 %   Anthropometric Measurements Height: 5\' 2"  (1.575 m) Weight: 232 lb (105.2 kg) BMI (Calculated): 42.42 Starting Weight: 232 lb Waist Measurement : 48 inches   Body Composition  Body Fat %: 48.1 % Fat Mass (lbs): 112 lbs Muscle Mass (lbs): 114.6 lbs Total Body Water (lbs): 77.2 lbs Visceral Fat Rating : 16   Other Clinical Data RMR: 1541 Fasting: yes Labs: yes Today's Visit #: 1 Starting Date: 10/06/23    EKG: Normal sinus rhythm, rate of 72.  General: Cooperative, alert, well developed, in no acute  distress. HEENT: Conjunctivae and lids unremarkable. Cardiovascular: Regular rhythm.  Lungs: Normal work of breathing. Neurologic: No focal deficits.   Lab Results  Component Value Date   CREATININE 0.88 10/03/2022   BUN 8 10/03/2022   NA 142 10/03/2022   K 4.5 10/03/2022   CL 106 10/03/2022   CO2 29 10/03/2022   Lab Results  Component Value Date   ALT 20 10/03/2022   AST 30 10/03/2022   ALKPHOS 75 10/03/2022   BILITOT 0.5 10/03/2022   Lab Results  Component Value Date    HGBA1C 5.4 04/07/2023   HGBA1C 5.9 10/03/2022   HGBA1C 6.0 03/04/2022   HGBA1C 5.9 09/25/2021   HGBA1C 5.6 09/08/2019   No results found for: "INSULIN " Lab Results  Component Value Date   TSH 1.44 10/03/2022   Lab Results  Component Value Date   CHOL 247 (H) 10/03/2022   HDL 43.80 10/03/2022   LDLCALC 167 (H) 10/03/2022   LDLDIRECT 141.0 09/08/2020   TRIG 180.0 (H) 10/03/2022   CHOLHDL 6 10/03/2022   Lab Results  Component Value Date   WBC 6.1 10/03/2022   HGB 13.7 10/03/2022   HCT 40.3 10/03/2022   MCV 95.0 10/03/2022   PLT 227.0 10/03/2022   No results found for: "IRON", "TIBC", "FERRITIN"  Assessment and Plan:   Other Fatigue  Monique Carney does feel that her weight is causing her energy to be lower than it should be. Fatigue may be related to obesity, depression or many other causes. Labs will be ordered, and in the meanwhile, Ica will focus on self care including making healthy food choices, increasing physical activity and focusing on stress reduction.  Shortness of Breath  Monique Carney does feel that she gets out of breath more easily that she used to when she exercises. Monique Carney's shortness of breath appears to be obesity related and exercise induced. She has agreed to work on weight loss and gradually increase exercise to treat her exercise induced shortness of breath. Will continue to monitor closely.   Problem List Items Addressed This Visit       Cardiovascular and Mediastinum   Essential hypertension - Primary   Blood pressure well controlled today.  She is on medication of metoprolol  and hydrochlorothiazide .  She has only been on these medications and has been controlled without needing other medication trials.  No chest pain, chest pressure of headache.  CMP today.      Relevant Orders   Comprehensive metabolic panel with GFR     Endocrine   Thyroid  nodule benign follicular by biopsy 04/2022   Sees NP Delfin Fell in endocrine and is followed yearly.  Will order thyroid   panel today.      Relevant Orders   Thyroid  Panel With TSH     Musculoskeletal and Integument   Rheumatoid arthritis involving multiple sites with positive rheumatoid factor (HCC), on MTX   Followed by Dr. Henrine Logan Rheumatoid arthritis-CCP+, RF+, ANA 1:160, Difuse Synovitis of hands, feet, wrist, non erosive.    She is stable on methotrexate without many side effects.  She was just encouraged to go back up to the 9 a week instead of 8 a week.  Needs a B12 and Folate level today.      Relevant Medications   acetaminophen (TYLENOL) 500 MG tablet   naproxen (NAPROSYN) 500 MG tablet   Other Relevant Orders   Vitamin B12   Folate   CBC w/Diff/Platelet     Other   Morbid obesity (HCC)   Mixed  hyperlipidemia   Patient on rosuvastatin  daily.  No transaminitis and no myalgias (that aren't attributed to RA). FLP today.      Relevant Orders   Lipid Panel With LDL/HDL Ratio   Prediabetes   Last A1c in October better at 5.4.  Prior to that was 5.9.  She enjoys simple carbohydrates but tends to lose control when consuming them.       Relevant Orders   Hemoglobin A1c   Insulin , random   Vitamin D  deficiency   Not in Vitamin D  but historically has been low.  Repeat Vitamin D  level today.      Relevant Orders   VITAMIN D  25 Hydroxy (Vit-D Deficiency, Fractures)   Other Visit Diagnoses       BMI 40.0-44.9, adult (HCC)         Other fatigue       Relevant Orders   EKG 12-Lead       Norrine is currently in the action stage of change and her goal is to continue with weight loss efforts. I recommend Melinna begin the structured treatment plan as follows:  She has agreed to Category 2 Plan  Exercise goals: For substantial health benefits, adults should do at least 150 minutes (2 hours and 30 minutes) a week of moderate-intensity, or 75 minutes (1 hour and 15 minutes) a week of vigorous-intensity aerobic physical activity, or an equivalent combination of moderate- and vigorous-intensity aerobic  activity. Aerobic activity should be performed in episodes of at least 10 minutes, and preferably, it should be spread throughout the week.  Behavioral modification strategies:increasing lean protein intake, decreasing simple carbohydrates, meal planning and cooking strategies, avoiding temptations, and planning for success  She was informed of the importance of frequent follow-up visits to maximize her success with intensive lifestyle modifications for her multiple health conditions. She was informed we would discuss her lab results at her next visit unless there is a critical issue that needs to be addressed sooner. Merlyn Starring agreed to keep her next visit at the agreed upon time to discuss these results.  Labs ordered with plans to discuss at the next visit.   Attestation Statements: This is the patient's first visit at [facility]. The patient's NEW PATIENT PACKET was reviewed at length. Included in the packet: current and past health history, medications, allergies, ROS, gynecologic history (women only), surgical history, family history, social history, weight history, weight loss surgery history (for those that have had weight loss surgery), nutritional evaluation, mood and food questionnaire, PHQ9, Epworth questionnaire, sleep habits questionnaire, patient life and health improvement goals questionnaire. These will all be scanned into the patient's chart under media.   During the visit, I independently reviewed the patient's EKG, bioimpedance scale results, and indirect calorimeter results. I used this information to tailor a meal plan for the patient that will help her to lose weight and will improve her obesity-related conditions going forward. I performed a medically necessary appropriate examination and/or evaluation. I discussed the assessment and treatment plan with the patient. The patient was provided an opportunity to ask questions and all were answered. The patient agreed with  the plan and demonstrated an understanding of the instructions. Labs were ordered at this visit and will be reviewed at the next visit unless more critical results need to be addressed immediately. Clinical information was updated and documented in the EMR.   Reviewed by clinician on day of visit: allergies, medications, problem list, medical history, surgical history, family history, social history,  and previous encounter notes.  Time spent on visit including pre-visit chart review and post-visit charting and care was 40 minutes.   Donaciano Frizzle, MD

## 2023-10-07 ENCOUNTER — Encounter: Payer: BC Managed Care – PPO | Admitting: Family Medicine

## 2023-10-07 ENCOUNTER — Encounter (INDEPENDENT_AMBULATORY_CARE_PROVIDER_SITE_OTHER): Admitting: Family Medicine

## 2023-10-08 LAB — CBC WITH DIFFERENTIAL/PLATELET
Basophils Absolute: 0 10*3/uL (ref 0.0–0.2)
Basos: 0 %
EOS (ABSOLUTE): 0.2 10*3/uL (ref 0.0–0.4)
Eos: 3 %
Hematocrit: 44.3 % (ref 34.0–46.6)
Hemoglobin: 14.5 g/dL (ref 11.1–15.9)
Immature Grans (Abs): 0 10*3/uL (ref 0.0–0.1)
Immature Granulocytes: 0 %
Lymphocytes Absolute: 1.5 10*3/uL (ref 0.7–3.1)
Lymphs: 20 %
MCH: 31.5 pg (ref 26.6–33.0)
MCHC: 32.7 g/dL (ref 31.5–35.7)
MCV: 96 fL (ref 79–97)
Monocytes Absolute: 0.4 10*3/uL (ref 0.1–0.9)
Monocytes: 5 %
Neutrophils Absolute: 5.2 10*3/uL (ref 1.4–7.0)
Neutrophils: 72 %
Platelets: 238 10*3/uL (ref 150–450)
RBC: 4.6 x10E6/uL (ref 3.77–5.28)
RDW: 14.2 % (ref 11.7–15.4)
WBC: 7.4 10*3/uL (ref 3.4–10.8)

## 2023-10-08 LAB — HEMOGLOBIN A1C
Est. average glucose Bld gHb Est-mCnc: 123 mg/dL
Hgb A1c MFr Bld: 5.9 % — ABNORMAL HIGH (ref 4.8–5.6)

## 2023-10-08 LAB — COMPREHENSIVE METABOLIC PANEL WITH GFR
ALT: 29 IU/L (ref 0–32)
AST: 21 IU/L (ref 0–40)
Albumin: 4.4 g/dL (ref 3.8–4.9)
Alkaline Phosphatase: 91 IU/L (ref 44–121)
BUN/Creatinine Ratio: 15 (ref 9–23)
BUN: 12 mg/dL (ref 6–24)
Bilirubin Total: 0.4 mg/dL (ref 0.0–1.2)
CO2: 22 mmol/L (ref 20–29)
Calcium: 9.6 mg/dL (ref 8.7–10.2)
Chloride: 101 mmol/L (ref 96–106)
Creatinine, Ser: 0.81 mg/dL (ref 0.57–1.00)
Globulin, Total: 3.2 g/dL (ref 1.5–4.5)
Glucose: 101 mg/dL — ABNORMAL HIGH (ref 70–99)
Potassium: 4.7 mmol/L (ref 3.5–5.2)
Sodium: 140 mmol/L (ref 134–144)
Total Protein: 7.6 g/dL (ref 6.0–8.5)
eGFR: 84 mL/min/{1.73_m2} (ref >59)

## 2023-10-08 LAB — LIPID PANEL WITH LDL/HDL RATIO
Cholesterol, Total: 171 mg/dL (ref 100–199)
HDL: 40 mg/dL (ref >39)
LDL Chol Calc (NIH): 91 mg/dL (ref 0–99)
LDL/HDL Ratio: 2.3 ratio (ref 0.0–3.2)
Triglycerides: 239 mg/dL — ABNORMAL HIGH (ref 0–149)
VLDL Cholesterol Cal: 40 mg/dL (ref 5–40)

## 2023-10-08 LAB — THYROID PANEL WITH TSH
Free Thyroxine Index: 2.3 (ref 1.2–4.9)
T3 Uptake Ratio: 25 % (ref 24–39)
T4, Total: 9.1 ug/dL (ref 4.5–12.0)
TSH: 2.52 u[IU]/mL (ref 0.450–4.500)

## 2023-10-08 LAB — FOLATE: Folate: 15.6 ng/mL (ref >3.0)

## 2023-10-08 LAB — VITAMIN D 25 HYDROXY (VIT D DEFICIENCY, FRACTURES): Vit D, 25-Hydroxy: 36.4 ng/mL (ref 30.0–100.0)

## 2023-10-08 LAB — INSULIN, RANDOM: INSULIN: 28.6 u[IU]/mL — ABNORMAL HIGH (ref 2.6–24.9)

## 2023-10-08 LAB — VITAMIN B12: Vitamin B-12: 573 pg/mL (ref 232–1245)

## 2023-10-14 ENCOUNTER — Encounter: Payer: Self-pay | Admitting: Family Medicine

## 2023-10-14 ENCOUNTER — Ambulatory Visit (INDEPENDENT_AMBULATORY_CARE_PROVIDER_SITE_OTHER): Payer: BC Managed Care – PPO | Admitting: Family Medicine

## 2023-10-14 VITALS — BP 120/74 | HR 78 | Temp 97.7°F | Ht 62.0 in | Wt 228.0 lb

## 2023-10-14 DIAGNOSIS — I10 Essential (primary) hypertension: Secondary | ICD-10-CM

## 2023-10-14 DIAGNOSIS — R7303 Prediabetes: Secondary | ICD-10-CM

## 2023-10-14 DIAGNOSIS — Z0001 Encounter for general adult medical examination with abnormal findings: Secondary | ICD-10-CM

## 2023-10-14 DIAGNOSIS — E782 Mixed hyperlipidemia: Secondary | ICD-10-CM

## 2023-10-14 DIAGNOSIS — Z0000 Encounter for general adult medical examination without abnormal findings: Secondary | ICD-10-CM

## 2023-10-14 DIAGNOSIS — M0579 Rheumatoid arthritis with rheumatoid factor of multiple sites without organ or systems involvement: Secondary | ICD-10-CM

## 2023-10-14 MED ORDER — ROSUVASTATIN CALCIUM 10 MG PO TABS: 10.0000 mg | ORAL_TABLET | Freq: Every day | ORAL | 3 refills | Status: AC

## 2023-10-14 NOTE — Progress Notes (Signed)
 Subjective  Chief Complaint  Patient presents with   Annual Exam    Pt here for Annual Exam and is currently fasting    Hypertension    HPI: Monique Carney is a 58 y.o. female who presents to [facility] [place] today for a Female Wellness Visit. She also has the concerns and/or needs as listed above in the chief complaint. These will be addressed in addition to the Health Maintenance Visit.   Wellness Visit: annual visit with health maintenance review and exam  HM: pap due 2027; colonoscopy due next year. Dr. Tova Fresh. Mammo current. Chronic disease f/u and/or acute problem visit: (deemed necessary to be done in addition to the wellness visit): Obesity: seeing healthy weight and wellness; reviewed notes and labs. Motivated for change.  HLD on crestor  and fasting for recheck. Tolerating well HTN has been very well controlled on hydrochlorothiazide  and metoprolol . No cp or sob or edema.  RA is fairly well controlled. Some worsening due to dose changes and missed doses. Sees rheum on methotrexate.  Prediabetes is stable by labs. Wants to improve with diet changes and weight loss.   Assessment  1. Encounter for well adult exam with abnormal findings   2. Essential hypertension   3. Mixed hyperlipidemia   4. Morbid obesity (HCC)   5. Prediabetes   6. Rheumatoid arthritis involving multiple sites with positive rheumatoid factor (HCC), on MTX      Plan  Female Wellness Visit: Age appropriate Health Maintenance and Prevention measures were discussed with patient. Included topics are cancer screening recommendations, ways to keep healthy (see AVS) including dietary and exercise recommendations, regular eye and dental care, use of seat belts, and avoidance of moderate alcohol use and tobacco use.  BMI: discussed patient's BMI and encouraged positive lifestyle modifications to help get to or maintain a target BMI. HM needs and immunizations were addressed and ordered. See below  for orders. See HM and immunization section for updates. Reviewed labs. See below.  Discussed recommendations regarding Vit D and calcium  supplementation (see AVS)  Chronic disease management visit and/or acute problem visit: Assessment and Plan    Rheumatoid arthritis Flaring due to missed medication and increased activity. Difficulty distinguishing flare-ups from activity-related pain. - Ensure adherence to medication regimen.  Prediabetes A1c 5.9 indicates prediabetes. Emphasized diet and exercise for management and potential reversal. - Encourage diet and exercise. - Limit processed foods and increase whole foods.  Hypertriglyceridemia Triglycerides at 239 mg/dL. Discussed dietary changes and fish oil supplementation. - Implement dietary changes. - Consider over-the-counter fish oil supplement.  Vitamin D  deficiency Previous low levels, recent levels in 40s. Recommended daily supplement. - Start daily vitamin D  supplement of 1000 IU.  HLD and HTN are well controlled.   Wellness Visit Blood pressure controlled. Screenings up to date. Discussed lifestyle changes for health and weight management. Lost five pounds with dietary changes. - Continue current blood pressure management. - Encourage continuation of lifestyle changes.       Follow up: 12 mo for cpe  No orders of the defined types were placed in this encounter.  Meds ordered this encounter  Medications   rosuvastatin  (CRESTOR ) 10 MG tablet    Sig: Take 1 tablet (10 mg total) by mouth daily.    Dispense:  90 tablet    Refill:  3      Body mass index is 41.7 kg/m. Wt Readings from Last 3 Encounters:  10/14/23 228 lb (103.4 kg)  10/06/23 232  lb (105.2 kg)  09/08/23 230 lb (104.3 kg)     Patient Active Problem List   Diagnosis Date Noted Date Diagnosed   Mixed hyperlipidemia 03/11/2022     Priority: High    Started crestor  02/2022    Prediabetes 03/11/2022     Priority: High    a1c 6.0 02/2022     Rheumatoid arthritis involving multiple sites with positive rheumatoid factor (HCC), on MTX 03/08/2017     Priority: High    Followed by Dr. Henrine Logan Rheumatoid arthritis-CCP+, RF+, ANA 1:160, Difuse Synovitis of hands, feet, wrist, non erosive.     Essential hypertension 01/16/2017     Priority: High   Morbid obesity (HCC) 01/16/2017     Priority: High   Gastroesophageal reflux disease without esophagitis 10/03/2022     Priority: Medium     Resolved with short course of omeprazole  and weight loss, 2023    Hyperplastic colonic polyp 03/16/2021     Priority: Medium    Family history of premature CAD 09/08/2019     Priority: Medium    Vitamin D  deficiency 10/06/2023     Priority: Low   Thyroid  nodule benign follicular by biopsy 04/2022 03/01/2022     Priority: Low    2.3 cm noted on CTA 02/2022; US  and the US  guided biopsy 04/2022.     Pulmonary nodules 03/01/2022     Priority: Low    Multiple < 4mm on CTA 02/2022. No further imaging recommended.     Health Maintenance  Topic Date Due   COVID-19 Vaccine (4 - 2024-25 season) 10/30/2023 (Originally 02/23/2023)   INFLUENZA VACCINE  01/23/2024   MAMMOGRAM  05/06/2024   Colonoscopy  01/05/2025   Cervical Cancer Screening (HPV/Pap Cotest)  09/08/2025   DTaP/Tdap/Td (2 - Td or Tdap) 06/02/2030   Hepatitis C Screening  Completed   HPV VACCINES  Aged Out   Meningococcal B Vaccine  Aged Out   HIV Screening  Discontinued   Zoster Vaccines- Shingrix  Discontinued   Immunization History  Administered Date(s) Administered   Influenza, Quadrivalent, Recombinant, Inj, Pf 03/26/2018, 05/06/2019   Influenza,inj,Quad PF,6+ Mos 06/02/2020, 03/16/2021, 03/04/2022   Influenza-Unspecified 04/07/2013   PFIZER(Purple Top)SARS-COV-2 Vaccination 09/02/2019, 09/27/2019, 04/14/2020   Tdap 06/02/2020   Tetanus 06/24/2002   Zoster Recombinant(Shingrix) 09/08/2020   We updated and reviewed the patient's past history in detail and it is documented  below. Allergies: Patient is allergic to shingrix [zoster vac recomb adjuvanted]. Past Medical History Patient  has a past medical history of Acid reflux, Bartholin's gland abscess, right, Bunion, Condyloma acuminatum of perianal region, Hypercholesteremia, Hypertension, Meningitis spinal, Obesity, Prediabetes (03/11/2022), and Rheumatoid arthritis (HCC). Past Surgical History Patient  has a past surgical history that includes Cataract extraction (Right); perirectal biopsy (02/19/2005); Tooth extraction; and dental implant. Family History: Patient family history includes CAD (age of onset: 49) in her father; Cancer in her mother; Hyperlipidemia in her father; Hypertension in her brother and father. Social History:  Patient  reports that she has quit smoking. Her smoking use included cigarettes. She has never used smokeless tobacco. She reports current alcohol use of about 5.0 standard drinks of alcohol per week. She reports that she does not use drugs.  Review of Systems: Constitutional: negative for fever or malaise Ophthalmic: negative for photophobia, double vision or loss of vision Cardiovascular: negative for chest pain, dyspnea on exertion, or new LE swelling Respiratory: negative for SOB or persistent cough Gastrointestinal: negative for abdominal pain, change in bowel habits or melena  Genitourinary: negative for dysuria or gross hematuria, no abnormal uterine bleeding or disharge Musculoskeletal: negative for new gait disturbance or muscular weakness Integumentary: negative for new or persistent rashes, no breast lumps Neurological: negative for TIA or stroke symptoms Psychiatric: negative for SI or delusions Allergic/Immunologic: negative for hives  Patient Care Team    Relationship Specialty Notifications Start End  Luevenia Saha, MD PCP - General Family Medicine  09/08/19   Alvester Aw, MD  Rheumatology  07/10/18   Tami Falcon, MD Consulting Physician Gastroenterology   03/16/21     Objective  Vitals: BP 120/74   Pulse 78   Temp 97.7 F (36.5 C)   Ht 5\' 2"  (1.575 m)   Wt 228 lb (103.4 kg)   LMP 04/25/2011   SpO2 92%   BMI 41.70 kg/m  General:  Well developed, well nourished, no acute distress  Psych:  Alert and orientedx3,normal mood and affect HEENT:  Normocephalic, atraumatic, non-icteric sclera,  supple neck without adenopathy, mass or thyromegaly Cardiovascular:  Normal S1, S2, RRR without gallop, rub or murmur Respiratory:  Good breath sounds bilaterally, CTAB with normal respiratory effort Gastrointestinal: normal bowel sounds, soft, non-tender, no noted masses. No HSM MSK: extremities without edema, joints without erythema or swelling Neurologic:    Mental status is normal.  Gross motor and sensory exams are normal.  No tremor  No visits with results within 1 Day(s) from this visit.  Latest known visit with results is:  Office Visit on 10/06/2023  Component Date Value Ref Range Status   Vitamin B-12 10/06/2023 573  232 - 1,245 pg/mL Final   Glucose 10/06/2023 101 (H)  70 - 99 mg/dL Final   BUN 16/03/9603 12  6 - 24 mg/dL Final   Creatinine, Ser 10/06/2023 0.81  0.57 - 1.00 mg/dL Final   eGFR 54/02/8118 84  >59 mL/min/1.73 Final   BUN/Creatinine Ratio 10/06/2023 15  9 - 23 Final   Sodium 10/06/2023 140  134 - 144 mmol/L Final   Potassium 10/06/2023 4.7  3.5 - 5.2 mmol/L Final   Chloride 10/06/2023 101  96 - 106 mmol/L Final   CO2 10/06/2023 22  20 - 29 mmol/L Final   Calcium  10/06/2023 9.6  8.7 - 10.2 mg/dL Final   Total Protein 14/78/2956 7.6  6.0 - 8.5 g/dL Final   Albumin 21/30/8657 4.4  3.8 - 4.9 g/dL Final   Globulin, Total 10/06/2023 3.2  1.5 - 4.5 g/dL Final   Bilirubin Total 10/06/2023 0.4  0.0 - 1.2 mg/dL Final   Alkaline Phosphatase 10/06/2023 91  44 - 121 IU/L Final   AST 10/06/2023 21  0 - 40 IU/L Final   ALT 10/06/2023 29  0 - 32 IU/L Final   Folate 10/06/2023 15.6  >3.0 ng/mL Final   Hgb A1c MFr Bld 10/06/2023 5.9 (H)   4.8 - 5.6 % Final   Est. average glucose Bld gHb Est-m* 10/06/2023 123  mg/dL Final   INSULIN  10/06/2023 28.6 (H)  2.6 - 24.9 uIU/mL Final   Cholesterol, Total 10/06/2023 171  100 - 199 mg/dL Final   Triglycerides 84/69/6295 239 (H)  0 - 149 mg/dL Final   HDL 28/41/3244 40  >39 mg/dL Final   VLDL Cholesterol Cal 10/06/2023 40  5 - 40 mg/dL Final   LDL Chol Calc (NIH) 10/06/2023 91  0 - 99 mg/dL Final   LDL/HDL Ratio 06/26/7251 2.3  0.0 - 3.2 ratio Final   TSH 10/06/2023 2.520  0.450 - 4.500 uIU/mL  Final   T4, Total 10/06/2023 9.1  4.5 - 12.0 ug/dL Final   T3 Uptake Ratio 10/06/2023 25  24 - 39 % Final   Free Thyroxine Index 10/06/2023 2.3  1.2 - 4.9 Final   Vit D, 25-Hydroxy 10/06/2023 36.4  30.0 - 100.0 ng/mL Final   WBC 10/06/2023 7.4  3.4 - 10.8 x10E3/uL Final   RBC 10/06/2023 4.60  3.77 - 5.28 x10E6/uL Final   Hemoglobin 10/06/2023 14.5  11.1 - 15.9 g/dL Final   Hematocrit 69/62/9528 44.3  34.0 - 46.6 % Final   MCV 10/06/2023 96  79 - 97 fL Final   MCH 10/06/2023 31.5  26.6 - 33.0 pg Final   MCHC 10/06/2023 32.7  31.5 - 35.7 g/dL Final   RDW 41/32/4401 14.2  11.7 - 15.4 % Final   Platelets 10/06/2023 238  150 - 450 x10E3/uL Final   Neutrophils 10/06/2023 72  Not Estab. % Final   Lymphs 10/06/2023 20  Not Estab. % Final   Monocytes 10/06/2023 5  Not Estab. % Final   Eos 10/06/2023 3  Not Estab. % Final   Basos 10/06/2023 0  Not Estab. % Final   Neutrophils Absolute 10/06/2023 5.2  1.4 - 7.0 x10E3/uL Final   Lymphocytes Absolute 10/06/2023 1.5  0.7 - 3.1 x10E3/uL Final   Monocytes Absolute 10/06/2023 0.4  0.1 - 0.9 x10E3/uL Final   EOS (ABSOLUTE) 10/06/2023 0.2  0.0 - 0.4 x10E3/uL Final   Basophils Absolute 10/06/2023 0.0  0.0 - 0.2 x10E3/uL Final   Immature Granulocytes 10/06/2023 0  Not Estab. % Final   Immature Grans (Abs) 10/06/2023 0.0  0.0 - 0.1 x10E3/uL Final    Commons side effects, risks, benefits, and alternatives for medications and treatment plan prescribed today  were discussed, and the patient expressed understanding of the given instructions. Patient is instructed to call or message via MyChart if he/she has any questions or concerns regarding our treatment plan. No barriers to understanding were identified. We discussed Red Flag symptoms and signs in detail. Patient expressed understanding regarding what to do in case of urgent or emergency type symptoms.  Medication list was reconciled, printed and provided to the patient in AVS. Patient instructions and summary information was reviewed with the patient as documented in the AVS. This note was prepared with assistance of Dragon voice recognition software. Occasional wrong-word or sound-a-like substitutions may have occurred due to the inherent limitations of voice recognition software

## 2023-10-14 NOTE — Patient Instructions (Addendum)
 Please return in 12 months for your annual complete physical; please come fasting.   If you have any questions or concerns, please don't hesitate to send me a message via MyChart or call the office at [phone]. Thank you for visiting with us  today! It's our pleasure caring for you.   VISIT SUMMARY:  During your follow-up visit, we discussed your concerns about your triglyceride levels, rheumatoid arthritis flare-ups, vitamin D  levels, and your progress with weight loss and dietary changes. We reviewed your current medications and made some recommendations to help manage your conditions more effectively.  YOUR PLAN:  -RHEUMATOID ARTHRITIS: Rheumatoid arthritis is an autoimmune condition that causes joint inflammation and pain. Your recent flare-ups may be due to missed medication doses and increased physical activity. Please ensure you take your medication as prescribed to help manage your symptoms.  -PREDIABETES: Prediabetes means your blood sugar levels are higher than normal but not high enough to be classified as diabetes. Your A1c level is 5.9. To manage and potentially reverse this condition, continue focusing on diet and exercise, limiting processed foods, and increasing whole foods.  -HYPERTRIGLYCERIDEMIA: Hypertriglyceridemia means you have high levels of triglycerides in your blood, which can increase your risk of heart disease. Your level is 239 mg/dL. We discussed making dietary changes and considering an over-the-counter fish oil supplement to help lower your triglycerides.  -VITAMIN D  DEFICIENCY: Vitamin D  deficiency means you have lower than normal levels of vitamin D , which is important for bone health and immune function. Your recent levels are in the 40s. Start taking a daily vitamin D  supplement of 1000 IU to help maintain adequate levels.  Monique Carney VISIT: Your blood pressure is well controlled, and your screenings are up to date. You have lost five pounds by making dietary  changes, which is excellent progress. Continue with your current blood pressure management and lifestyle changes to support your overall health and weight management.  INSTRUCTIONS:  Please follow up with your previous doctor regarding your insurance coverage concerns. Ensure you adhere to your rheumatoid arthritis medication regimen to prevent flare-ups. Consider adding an over-the-counter fish oil supplement to your diet to help manage your triglyceride levels. Start taking a daily vitamin D  supplement of 1000 IU. Continue with your dietary changes and exercise to manage your prediabetes and support your weight loss efforts.

## 2023-10-20 ENCOUNTER — Ambulatory Visit (INDEPENDENT_AMBULATORY_CARE_PROVIDER_SITE_OTHER): Admitting: Family Medicine

## 2023-10-20 ENCOUNTER — Encounter (INDEPENDENT_AMBULATORY_CARE_PROVIDER_SITE_OTHER): Payer: Self-pay | Admitting: Family Medicine

## 2023-10-20 VITALS — BP 113/76 | HR 71 | Temp 98.0°F | Ht 62.0 in | Wt 224.0 lb

## 2023-10-20 DIAGNOSIS — R7303 Prediabetes: Secondary | ICD-10-CM | POA: Diagnosis not present

## 2023-10-20 DIAGNOSIS — Z6841 Body Mass Index (BMI) 40.0 and over, adult: Secondary | ICD-10-CM

## 2023-10-20 DIAGNOSIS — E559 Vitamin D deficiency, unspecified: Secondary | ICD-10-CM

## 2023-10-20 DIAGNOSIS — E782 Mixed hyperlipidemia: Secondary | ICD-10-CM | POA: Diagnosis not present

## 2023-10-20 NOTE — Assessment & Plan Note (Signed)

## 2023-10-20 NOTE — Progress Notes (Addendum)
 SUBJECTIVE:  Chief Complaint: Obesity  Interim History: Patient enjoyed her first couple of weeks.  She realizes she drank more than she thought she did.  She did not drink at all.  The microwave meal lunches have been very convenient.  No hunger and no cravings.  She did skip a few parts of the meals due to time. Biggest obstacle in the next few weeks is social drinking and social events.  Monique Carney is here to discuss her progress with her obesity treatment plan. She is on the Category 2 Plan and states she is following her eating plan approximately 90-95 % of the time. She states she is exercising less than normal this past few weeks.   OBJECTIVE: Visit Diagnoses: Problem List Items Addressed This Visit       Other   Morbid obesity (HCC)   Mixed hyperlipidemia   The 10-year ASCVD risk score (Arnett DK, et al., 2019) is: 3.1%   Values used to calculate the score:     Age: 58 years     Sex: Female     Is Non-Hispanic African American: No     Diabetic: No     Tobacco smoker: No     Systolic Blood Pressure: 113 mmHg     Is BP treated: Yes     HDL Cholesterol: 40 mg/dL     Total Cholesterol: 171 mg/dL  On crestor  currently.      Prediabetes   Pathophysiology of progression through insulin  resistance to prediabetes and diabetes was discussed at length today.  Patient to continue to monitor and be in control of total intake of snack calories which may be simple carbohydrates but should be consumed only after the patient has taken in all the nutrition for the day.  Macronutrient identification, classification and daily intake ratios were discussed.  Plan to repeat labs in 3 months to monitor both hemoglobin A1c and insulin  levels.  No medications at this time as patient is not having significant hunger or cravings that would make following meal plan more difficult.         Vitamin D  deficiency - Primary   Discussed importance of vitamin d  supplementation.  Vitamin d  supplementation has  been shown to decrease fatigue, decrease risk of progression to insulin  resistance and then prediabetes, decreases risk of falling in older age and can even assist in decreasing depressive symptoms in PTSD.   Patient to start 5k international units  daily.      Other Visit Diagnoses       BMI 40.0-44.9, adult (HCC)           Vitals Temp: 98 F (36.7 C) BP: 113/76 Pulse Rate: 71 SpO2: 96 %   Anthropometric Measurements Height: 5\' 2"  (1.575 m) Weight: 224 lb (101.6 kg) BMI (Calculated): 40.96 Weight at Last Visit: 232 lb Weight Lost Since Last Visit: 8 lb Starting Weight: 232 lb Total Weight Loss (lbs): 8 lb (3.629 kg)   Body Composition  Body Fat %: 47.3 % Fat Mass (lbs): 106.2 lbs Muscle Mass (lbs): 112.2 lbs Total Body Water (lbs): 76.2 lbs Visceral Fat Rating : 15   Other Clinical Data Today's Visit #: 2 Starting Date: 10/06/23 Comments: Cat 2     ASSESSMENT AND PLAN:  Diet: Monique Carney is currently in the action stage of change. As such, her goal is to continue with weight loss efforts and has agreed to the Category 2 Plan 1200-1350 calories and 85 or more grams of protein daily.  Patient  to start food log or journaling meal plan.  The initial goal will be to habitually log or journal for at least 4 days a week.  The expectation it that patient may not initially meet calorie or protein goals as the nturitional understanding of food intake is begun.  We discussed the 10:1 ratio when reading a food label.  Patient agrees to keep a food log either electronically or on paper and bring to the next appointment to be able to dissect and discuss it with provider.     Exercise:  All adults should avoid inactivity. Some activity is better than none, and adults who participate in any amount of physical activity, gain some health benefits.  Behavior Modification:  We discussed the following Behavioral Modification Strategies today: increasing lean protein intake, decreasing  simple carbohydrates, meal planning and cooking strategies, planning for success, and keep a strict food journal.   Return in about 2 weeks (around 11/03/2023).Monique Carney She was informed of the importance of frequent follow up visits to maximize her success with intensive lifestyle modifications for her multiple health conditions.  Attestation Statements:   Reviewed by clinician on day of visit: allergies, medications, problem list, medical history, surgical history, family history, social history, and previous encounter notes.   Time spent on visit including pre-visit chart review and post-visit care and charting was 30 minutes  Donaciano Frizzle, MD

## 2023-10-20 NOTE — Assessment & Plan Note (Signed)
 The 10-year ASCVD risk score (Arnett DK, et al., 2019) is: 3.1%   Values used to calculate the score:     Age: 58 years     Sex: Female     Is Non-Hispanic African American: No     Diabetic: No     Tobacco smoker: No     Systolic Blood Pressure: 113 mmHg     Is BP treated: Yes     HDL Cholesterol: 40 mg/dL     Total Cholesterol: 171 mg/dL  On crestor  currently.

## 2023-10-20 NOTE — Assessment & Plan Note (Addendum)
 Discussed importance of vitamin d  supplementation.  Vitamin d  supplementation has been shown to decrease fatigue, decrease risk of progression to insulin  resistance and then prediabetes, decreases risk of falling in older age and can even assist in decreasing depressive symptoms in PTSD.   Patient to start 5k international units  daily.

## 2023-11-16 ENCOUNTER — Other Ambulatory Visit: Payer: Self-pay | Admitting: Family Medicine

## 2023-11-27 ENCOUNTER — Ambulatory Visit (INDEPENDENT_AMBULATORY_CARE_PROVIDER_SITE_OTHER): Admitting: Family Medicine

## 2023-11-27 ENCOUNTER — Encounter (INDEPENDENT_AMBULATORY_CARE_PROVIDER_SITE_OTHER): Payer: Self-pay | Admitting: Family Medicine

## 2023-11-27 VITALS — BP 109/72 | HR 84 | Temp 98.0°F | Ht 62.0 in | Wt 217.0 lb

## 2023-11-27 DIAGNOSIS — Z6839 Body mass index (BMI) 39.0-39.9, adult: Secondary | ICD-10-CM | POA: Diagnosis not present

## 2023-11-27 DIAGNOSIS — I10 Essential (primary) hypertension: Secondary | ICD-10-CM | POA: Diagnosis not present

## 2023-11-27 DIAGNOSIS — E559 Vitamin D deficiency, unspecified: Secondary | ICD-10-CM | POA: Diagnosis not present

## 2023-11-27 NOTE — Assessment & Plan Note (Signed)
 Blood pressure well controlled today.  She is on metoprolol  50mg  daily.  She voices that she was on 25mg  previously.  No dizziness or lightheadedness.  If BP stays as controlled at next appointment will decrease to 25mg  again.

## 2023-11-27 NOTE — Progress Notes (Signed)
 SUBJECTIVE:  Chief Complaint: Obesity  Interim History: Patient did really well logging initially then stopped logging and tried to get back on logging.  She had about 5 days of abnormal food intake but did the best she could those days. For the month of June she does not have anything coming up that will make managing her food more difficult.  She voices that what is amazing that she was able to stay structured over [facility] Day weekend.   Monique Carney is here to discuss her progress with her obesity treatment plan. She is on the Category 2 Plan and keeping a food journal and adhering to recommended goals of 1250-1350 calories and 85 grams of protein and states she is following her eating plan approximately 90 % of the time. She states she is exercising 60 minutes 5 times per week.   OBJECTIVE: Visit Diagnoses: Problem List Items Addressed This Visit       Cardiovascular and Mediastinum   Essential hypertension - Primary   Blood pressure well controlled today.  She is on metoprolol  50mg  daily.  She voices that she was on 25mg  previously.  No dizziness or lightheadedness.  If BP stays as controlled at next appointment will decrease to 25mg  again.        Other   Morbid obesity (HCC)   Vitamin D  deficiency   Patient started OTC 5k international units  daily.  No nausea, vomiting or muscle weakness.  Continue OTC- will repeat level in August.      Other Visit Diagnoses       BMI 39.0-39.9,adult           Vitals Temp: 98 F (36.7 C) BP: 109/72 Pulse Rate: 84 SpO2: 95 %   Anthropometric Measurements Height: 5\' 2"  (1.575 m) Weight: 217 lb (98.4 kg) BMI (Calculated): 39.68 Weight at Last Visit: 224 lb Weight Lost Since Last Visit: 7 lb Weight Gained Since Last Visit: 0 Starting Weight: 232 lb Total Weight Loss (lbs): 15 lb (6.804 kg)   Body Composition  Body Fat %: 46.6 % Fat Mass (lbs): 101.2 lbs Muscle Mass (lbs): 110 lbs Total Body Water (lbs): 74.8 lbs Visceral Fat  Rating : 15   Other Clinical Data Today's Visit #: 3 Starting Date: 10/06/23 Comments: Cat 2     ASSESSMENT AND PLAN:  Diet: Anderson is currently in the action stage of change. As such, her goal is to continue with weight loss efforts and has agreed to the Category 2 Plan and keeping a food journal and adhering to recommended goals of 1200-1300 calories and 85 or more grams protein daily.   Exercise:  For substantial health benefits, adults should do at least 150 minutes (2 hours and 30 minutes) a week of moderate-intensity, or 75 minutes (1 hour and 15 minutes) a week of vigorous-intensity aerobic physical activity, or an equivalent combination of moderate- and vigorous-intensity aerobic activity. Aerobic activity should be performed in episodes of at least 10 minutes, and preferably, it should be spread throughout the week.  Behavior Modification:  We discussed the following Behavioral Modification Strategies today: increasing lean protein intake, decreasing simple carbohydrates, increasing vegetables, meal planning and cooking strategies, and keeping healthy foods in the home.   Return in about 3 weeks (around 12/18/2023).   She was informed of the importance of frequent follow up visits to maximize her success with intensive lifestyle modifications for her multiple health conditions.  Attestation Statements:   Reviewed by clinician on day of visit: allergies, medications,  problem list, medical history, surgical history, family history, social history, and previous encounter notes.     Donaciano Frizzle, MD

## 2023-11-27 NOTE — Assessment & Plan Note (Signed)
 Patient started OTC 5k international units  daily.  No nausea, vomiting or muscle weakness.  Continue OTC- will repeat level in August.

## 2023-12-30 ENCOUNTER — Ambulatory Visit (INDEPENDENT_AMBULATORY_CARE_PROVIDER_SITE_OTHER): Admitting: Family Medicine

## 2023-12-30 ENCOUNTER — Encounter (INDEPENDENT_AMBULATORY_CARE_PROVIDER_SITE_OTHER): Payer: Self-pay | Admitting: Family Medicine

## 2023-12-30 VITALS — BP 131/82 | HR 81 | Temp 97.7°F | Ht 62.0 in | Wt 219.0 lb

## 2023-12-30 DIAGNOSIS — R7303 Prediabetes: Secondary | ICD-10-CM | POA: Diagnosis not present

## 2023-12-30 DIAGNOSIS — Z6841 Body Mass Index (BMI) 40.0 and over, adult: Secondary | ICD-10-CM | POA: Diagnosis not present

## 2023-12-30 NOTE — Progress Notes (Signed)
   SUBJECTIVE:  Chief Complaint: Obesity  Interim History: Since last appointment she has not been doing much.  She felt she did well with maintaining.  She was out of town for 2 weeks and thought she did reasonably at implementing small changes. She thinks she may have gotten overconfident with her intake.  She then went out of town again but then upon returning had gained about 4lbs.  Biggest thing coming up is her anniversary and maybe going to her mother's for a week.  Monique Carney is here to discuss her progress with her obesity treatment plan. She is on the Category 2 Plan and states she is following her eating plan approximately 40 % of the time. She states she is exercising 45 minutes 5 times per week.   OBJECTIVE: Visit Diagnoses: Problem List Items Addressed This Visit       Other   Morbid obesity (HCC)   Prediabetes - Primary   Other Visit Diagnoses       BMI 40.0-44.9, adult (HCC)           No data recorded  No data recorded  No data recorded  No data recorded    12/30/2023    9:00 AM 11/27/2023    9:00 AM 10/20/2023    2:00 PM  Vitals with BMI  Height 5' 2 5' 2 5' 2  Weight 219 lbs 217 lbs 224 lbs  BMI 40.05 39.68 40.96  Systolic 131 109 886  Diastolic 82 72 76  Pulse 81 84 71      ASSESSMENT AND PLAN: Assessment & Plan Prediabetes Patient has tried to implement small changes of eating protein first when eating out .  She voices that she didn't have the same amount of control when out of town as she did when at home. Morbid obesity (HCC)  BMI 40.0-44.9, adult (HCC)    Diet: Monique Carney is currently in the action stage of change. As such, her goal is to continue with weight loss efforts and has agreed to the Category 2 Plan and keeping a food journal and adhering to recommended goals of 1200-1300 calories and 85 or more grams protein daily.   Exercise:  For substantial health benefits, adults should do at least 150 minutes (2 hours and 30 minutes) a week of  moderate-intensity, or 75 minutes (1 hour and 15 minutes) a week of vigorous-intensity aerobic physical activity, or an equivalent combination of moderate- and vigorous-intensity aerobic activity. Aerobic activity should be performed in episodes of at least 10 minutes, and preferably, it should be spread throughout the week.  Behavior Modification:  We discussed the following Behavioral Modification Strategies today: increasing lean protein intake, decreasing simple carbohydrates, increasing vegetables, and meal planning and cooking strategies.   Return in about 3 weeks (around 01/20/2024).   She was informed of the importance of frequent follow up visits to maximize her success with intensive lifestyle modifications for her multiple health conditions.  Attestation Statements:   Reviewed by clinician on day of visit: allergies, medications, problem list, medical history, surgical history, family history, social history, and previous encounter notes.     Monique Cho, MD

## 2024-01-05 NOTE — Assessment & Plan Note (Signed)
 Patient has tried to implement small changes of eating protein first when eating out .  She voices that she didn't have the same amount of control when out of town as she did when at home.

## 2024-01-20 DIAGNOSIS — Z79899 Other long term (current) drug therapy: Secondary | ICD-10-CM | POA: Diagnosis not present

## 2024-01-20 DIAGNOSIS — M0589 Other rheumatoid arthritis with rheumatoid factor of multiple sites: Secondary | ICD-10-CM | POA: Diagnosis not present

## 2024-01-20 DIAGNOSIS — M1990 Unspecified osteoarthritis, unspecified site: Secondary | ICD-10-CM | POA: Diagnosis not present

## 2024-01-26 DIAGNOSIS — H40013 Open angle with borderline findings, low risk, bilateral: Secondary | ICD-10-CM | POA: Diagnosis not present

## 2024-01-27 ENCOUNTER — Ambulatory Visit (INDEPENDENT_AMBULATORY_CARE_PROVIDER_SITE_OTHER): Admitting: Family Medicine

## 2024-02-03 ENCOUNTER — Ambulatory Visit (INDEPENDENT_AMBULATORY_CARE_PROVIDER_SITE_OTHER): Admitting: Family Medicine

## 2024-03-25 ENCOUNTER — Other Ambulatory Visit: Payer: Self-pay | Admitting: Nurse Practitioner

## 2024-03-25 DIAGNOSIS — E041 Nontoxic single thyroid nodule: Secondary | ICD-10-CM

## 2024-04-21 DIAGNOSIS — M0589 Other rheumatoid arthritis with rheumatoid factor of multiple sites: Secondary | ICD-10-CM | POA: Diagnosis not present

## 2024-05-12 DIAGNOSIS — Z1231 Encounter for screening mammogram for malignant neoplasm of breast: Secondary | ICD-10-CM | POA: Diagnosis not present

## 2024-05-12 LAB — HM MAMMOGRAPHY

## 2024-05-14 ENCOUNTER — Other Ambulatory Visit: Payer: Self-pay | Admitting: Family Medicine

## 2024-10-14 ENCOUNTER — Encounter: Admitting: Family Medicine
